# Patient Record
Sex: Male | Born: 1965 | Race: White | Hispanic: Yes | Marital: Married | Smoking: Never smoker
Health system: Southern US, Community
[De-identification: ages and names within clinical notes are randomized; demographics above are authoritative.]

## PROBLEM LIST (undated history)

## (undated) DIAGNOSIS — S92919A Unspecified fracture of unspecified toe(s), initial encounter for closed fracture: Secondary | ICD-10-CM

## (undated) DIAGNOSIS — S2239XA Fracture of one rib, unspecified side, initial encounter for closed fracture: Secondary | ICD-10-CM

## (undated) DIAGNOSIS — N200 Calculus of kidney: Secondary | ICD-10-CM

## (undated) HISTORY — DX: Fracture of one rib, unspecified side, initial encounter for closed fracture: S22.39XA

## (undated) HISTORY — DX: Calculus of kidney: N20.0

## (undated) HISTORY — DX: Unspecified fracture of unspecified toe(s), initial encounter for closed fracture: S92.919A

---

## 2010-06-19 DIAGNOSIS — N200 Calculus of kidney: Secondary | ICD-10-CM

## 2010-06-19 HISTORY — DX: Calculus of kidney: N20.0

## 2011-05-24 ENCOUNTER — Emergency Department (HOSPITAL_COMMUNITY): Payer: Self-pay

## 2011-05-24 ENCOUNTER — Emergency Department (HOSPITAL_COMMUNITY)
Admission: EM | Admit: 2011-05-24 | Discharge: 2011-05-24 | Disposition: A | Discharge status: Home / Self Care | Payer: Self-pay | Attending: Emergency Medicine | Admitting: Emergency Medicine

## 2011-05-24 ENCOUNTER — Encounter: Payer: Self-pay | Admitting: Emergency Medicine

## 2011-05-24 DIAGNOSIS — R112 Nausea with vomiting, unspecified: Secondary | ICD-10-CM | POA: Insufficient documentation

## 2011-05-24 DIAGNOSIS — N201 Calculus of ureter: Secondary | ICD-10-CM | POA: Insufficient documentation

## 2011-05-24 DIAGNOSIS — N200 Calculus of kidney: Secondary | ICD-10-CM

## 2011-05-24 DIAGNOSIS — R109 Unspecified abdominal pain: Secondary | ICD-10-CM | POA: Insufficient documentation

## 2011-05-24 LAB — URINE MICROSCOPIC-ADD ON

## 2011-05-24 LAB — POCT I-STAT, CHEM 8
Creatinine, Ser: 1 mg/dL (ref 0.50–1.35)
Glucose, Bld: 139 mg/dL — ABNORMAL HIGH (ref 70–99)
HCT: 47 % (ref 39.0–52.0)
Hemoglobin: 16 g/dL (ref 13.0–17.0)
Potassium: 4.2 mEq/L (ref 3.5–5.1)
Sodium: 143 mEq/L (ref 135–145)
TCO2: 25 mmol/L (ref 0–100)

## 2011-05-24 LAB — URINALYSIS, ROUTINE W REFLEX MICROSCOPIC
Bilirubin Urine: NEGATIVE
Specific Gravity, Urine: 1.028 (ref 1.005–1.030)
pH: 8 (ref 5.0–8.0)

## 2011-05-24 MED ORDER — HYDROCODONE-ACETAMINOPHEN 5-325 MG PO TABS: 2.0000 | ORAL_TABLET | ORAL | Status: AC | PRN

## 2011-05-24 MED ORDER — TAMSULOSIN HCL 0.4 MG PO CAPS: 0.4000 mg | ORAL_CAPSULE | Freq: Every day | ORAL | Status: DC

## 2011-05-24 NOTE — ED Notes (Signed)
Patient transported to CT 

## 2011-05-24 NOTE — ED Notes (Signed)
R sided abd pain that radiates to R flank since 1 pm with nausea and vomiting.  Decreased urination and pain with urination.

## 2011-05-24 NOTE — ED Provider Notes (Signed)
History     CSN: [id number] Arrival date & time: 05/24/2011  5:27 PM   First MD Initiated Contact with Patient 05/24/11 2104      Chief Complaint  Patient presents with  . Abdominal Pain     HPI  History provided by the patient. Patient presents with complaints of right flank and lower abdominal pain that began acutely around 1 PM today. Patient states pain was very extreme and was persistent for several hours. Pain felt like a sharp ache and cramp. Patient had some associated nausea and vomiting. He denies similar symptoms previously. Patient states at that time pain seemed to be worse with some movements and positions. Patient denies fever, chills, hematuria, urinary frequency, dysuria, diarrhea or constipation. Patient has no other significant past medical history.   History reviewed. No pertinent past medical history.  History reviewed. No pertinent past surgical history.  History reviewed. No pertinent family history.  History  Substance Use Topics  . Smoking status: Never Smoker   . Smokeless tobacco: Not on file  . Alcohol Use: No      Review of Systems  Constitutional: Negative for fever and chills.  Respiratory: Negative for cough and shortness of breath.   Cardiovascular: Negative for chest pain.  Gastrointestinal: Positive for nausea and vomiting. Negative for diarrhea and constipation.  Genitourinary: Negative for dysuria, frequency and hematuria.  All other systems reviewed and are negative.    Allergies  Review of patient's allergies indicates no known allergies.  Home Medications  No current outpatient prescriptions on file.  BP 121/76  Pulse 65  Temp(Src) 97.6 F (36.4 C) (Oral)  Resp 22  SpO2 96%  Physical Exam  Nursing note and vitals reviewed. Constitutional: He is oriented to person, place, and time. He appears well-developed and well-nourished. No distress.  HENT:  Head: Normocephalic.  Neck: Normal range of motion.  Cardiovascular:  Normal rate, regular rhythm and normal heart sounds.   Pulmonary/Chest: Effort normal and breath sounds normal. He has no wheezes. He has no rales.  Abdominal: Soft. There is no tenderness. There is no rigidity, no rebound, no guarding, no CVA tenderness, no tenderness at McBurney's point and negative Murphy's sign.  Neurological: He is alert and oriented to person, place, and time.  Skin: Skin is warm.  Psychiatric: His behavior is normal.    ED Course  Procedures (including critical care time)  Labs Reviewed  URINALYSIS, ROUTINE W REFLEX MICROSCOPIC - Abnormal; Notable for the following:    Hgb urine dipstick MODERATE (*)    Protein, ur 30 (*)    All other components within normal limits  URINE MICROSCOPIC-ADD ON   Results for orders placed during the hospital encounter of 05/24/11  URINALYSIS, ROUTINE W REFLEX MICROSCOPIC      Component Value Range   Color, Urine YELLOW  YELLOW    APPearance CLEAR  CLEAR    Specific Gravity, Urine 1.028  1.005 - 1.030    pH 8.0  5.0 - 8.0    Glucose, UA NEGATIVE  NEGATIVE (mg/dL)   Hgb urine dipstick MODERATE (*) NEGATIVE    Bilirubin Urine NEGATIVE  NEGATIVE    Ketones, ur NEGATIVE  NEGATIVE (mg/dL)   Protein, ur 30 (*) NEGATIVE (mg/dL)   Urobilinogen, UA 1.0  0.0 - 1.0 (mg/dL)   Nitrite NEGATIVE  NEGATIVE    Leukocytes, UA NEGATIVE  NEGATIVE   URINE MICROSCOPIC-ADD ON      Component Value Range   Squamous Epithelial / LPF RARE  RARE    WBC, UA 0-2  <3 (WBC/hpf)   RBC / HPF 21-50  <3 (RBC/hpf)   Urine-Other MUCOUS PRESENT    POCT I-STAT, CHEM 8      Component Value Range   Sodium 143  135 - 145 (mEq/L)   Potassium 4.2  3.5 - 5.1 (mEq/L)   Chloride 107  96 - 112 (mEq/L)   BUN 18  6 - 23 (mg/dL)   Creatinine, Ser 9.56  0.50 - 1.35 (mg/dL)   Glucose, Bld 213 (*) 70 - 99 (mg/dL)   Calcium, Ion 0.86  1.12 - 1.32 (mmol/L)   TCO2 25  0 - 100 (mmol/L)   Hemoglobin 16.0  13.0 - 17.0 (g/dL)   HCT 57.8  46.9 - 62.9 (%)     Ct Abdomen  Pelvis Wo Contrast  05/24/2011  *RADIOLOGY REPORT*  Clinical Data: Right flank pain.  Question ureteral calculus.  CT ABDOMEN AND PELVIS WITHOUT CONTRAST  Technique:  Multidetector CT imaging of the abdomen and pelvis was performed following the standard protocol without intravenous contrast.  Comparison: None.  Findings: There are small calcified granulomas at the right lung base.  There is an old rib fracture posteriorly on the right.  The right kidney demonstrates mild hydronephrosis and perinephric soft tissue stranding.  The right ureter is dilated to the ureteral vesicle junction where there is a 2 mm obstructing calculus on image 76.  This is not clearly seen on the scout image.  No renal calculi are demonstrated.  The left kidney appears normal.  There is mild hepatic steatosis.  The spleen, gallbladder, pancreas and adrenal glands appear normal.  The appendix appears normal. There is degenerative disc disease in the lower lumbar spine, most advanced at L5-S1.  Small central prostatic calcifications are noted.  IMPRESSION:  1.  Obstructing 2 mm calculus of the right ureteral vesicle junction. 2.  No renal calculi. 3.  Calcified granulomas at the right lung base. 4.  Hepatic steatosis. 5.  Lower lumbar spondylosis.  Original Report Authenticated By: Gerrianne Scale, M.D.     1. Kidney stone       MDM  9:00 PM patient seen and evaluated. Patient no acute distress and denies pain at this time.        Angus Seller, PA 05/25/11 [id number]

## 2011-05-25 NOTE — ED Provider Notes (Signed)
Medical screening examination/treatment/procedure(s) were performed by non-physician practitioner and as supervising physician I was immediately available for consultation/collaboration.   Joya Gaskins, MD 05/25/11 2158

## 2015-07-16 ENCOUNTER — Ambulatory Visit (INDEPENDENT_AMBULATORY_CARE_PROVIDER_SITE_OTHER): Payer: BLUE CROSS/BLUE SHIELD | Admitting: Physician Assistant

## 2015-07-16 VITALS — BP 118/74 | HR 109 | Temp 98.3°F | Resp 18 | Ht 62.5 in | Wt 146.8 lb

## 2015-07-16 DIAGNOSIS — Z13220 Encounter for screening for lipoid disorders: Secondary | ICD-10-CM

## 2015-07-16 DIAGNOSIS — R238 Other skin changes: Secondary | ICD-10-CM

## 2015-07-16 DIAGNOSIS — Z1329 Encounter for screening for other suspected endocrine disorder: Secondary | ICD-10-CM

## 2015-07-16 DIAGNOSIS — Z125 Encounter for screening for malignant neoplasm of prostate: Secondary | ICD-10-CM

## 2015-07-16 DIAGNOSIS — Z1159 Encounter for screening for other viral diseases: Secondary | ICD-10-CM | POA: Diagnosis not present

## 2015-07-16 DIAGNOSIS — Z13228 Encounter for screening for other metabolic disorders: Secondary | ICD-10-CM

## 2015-07-16 DIAGNOSIS — K625 Hemorrhage of anus and rectum: Secondary | ICD-10-CM

## 2015-07-16 DIAGNOSIS — Z114 Encounter for screening for human immunodeficiency virus [HIV]: Secondary | ICD-10-CM

## 2015-07-16 DIAGNOSIS — R233 Spontaneous ecchymoses: Secondary | ICD-10-CM

## 2015-07-16 DIAGNOSIS — Z1211 Encounter for screening for malignant neoplasm of colon: Secondary | ICD-10-CM

## 2015-07-16 LAB — POCT CBC
Granulocyte percent: 50.8 %G (ref 37–80)
HCT, POC: 43.1 % — AB (ref 43.5–53.7)
HEMOGLOBIN: 14.3 g/dL (ref 14.1–18.1)
LYMPH, POC: 1.9 (ref 0.6–3.4)
MCH: 26.2 pg — AB (ref 27–31.2)
MCHC: 33.2 g/dL (ref 31.8–35.4)
MCV: 78.8 fL — AB (ref 80–97)
MID (cbc): 0.5 (ref 0–0.9)
MPV: 6.5 fL (ref 0–99.8)
POC GRANULOCYTE: 2.4 (ref 2–6.9)
POC LYMPH PERCENT: 39.5 %L (ref 10–50)
POC MID %: 9.7 % (ref 0–12)
Platelet Count, POC: 242 10*3/uL (ref 142–424)
RBC: 5.47 M/uL (ref 4.69–6.13)
RDW, POC: 15.7 %
WBC: 4.8 10*3/uL (ref 4.6–10.2)

## 2015-07-16 LAB — COMPREHENSIVE METABOLIC PANEL
ALT: 26 U/L (ref 9–46)
AST: 27 U/L (ref 10–35)
Albumin: 4.2 g/dL (ref 3.6–5.1)
Alkaline Phosphatase: 64 U/L (ref 40–115)
BUN: 12 mg/dL (ref 7–25)
CHLORIDE: 102 mmol/L (ref 98–110)
CO2: 29 mmol/L (ref 20–31)
CREATININE: 0.79 mg/dL (ref 0.70–1.33)
Calcium: 9.2 mg/dL (ref 8.6–10.3)
GLUCOSE: 87 mg/dL (ref 65–99)
POTASSIUM: 4.3 mmol/L (ref 3.5–5.3)
SODIUM: 138 mmol/L (ref 135–146)
TOTAL PROTEIN: 8.1 g/dL (ref 6.1–8.1)
Total Bilirubin: 0.4 mg/dL (ref 0.2–1.2)

## 2015-07-16 LAB — LIPID PANEL
CHOL/HDL RATIO: 2.7 ratio (ref <=5.0)
CHOLESTEROL: 141 mg/dL (ref 125–200)
HDL: 53 mg/dL (ref >=40)
LDL Cholesterol: 77 mg/dL (ref <130)
TRIGLYCERIDES: 53 mg/dL (ref <150)
VLDL: 11 mg/dL (ref <30)

## 2015-07-16 LAB — TSH: TSH: 1.444 u[IU]/mL (ref 0.350–4.500)

## 2015-07-16 MED ORDER — HYDROCORTISONE ACETATE 25 MG RE SUPP: 25.0000 mg | Freq: Two times a day (BID) | RECTAL | Status: DC | PRN

## 2015-07-16 NOTE — Patient Instructions (Signed)
I will contact you with your lab results as soon as they are available.   If you have not heard from me in 2 weeks, please contact me.  The fastest way to get your results is to register for My Chart (see the instructions on the last page of this printout).  The gastroenterology office will call you directly (I asked that they call your wife) to schedule a visit there.

## 2015-07-16 NOTE — Progress Notes (Signed)
Subjective:    Patient ID: Roberto Hunter, male    DOB: 23-Jan-1966, 50 y.o.   MRN: [id number]  Chief Complaint  Patient presents with  . Rectal Bleeding    Noticed today  . Abdominal Pain    HPI Patient presents today for rectal bleeding that began at 10am this morning. Normally has BM 2x/day. Yesterday was unable pass BM. Reports straining today and when BM was passed  it only "dripped out and the water was completely red". Second bowl movement around 11:00am which was solid and red. Associated burning of rectal area after BM, and "uncomfertable feeling" of abdomen. He believes it may be due to a hemorrhoid but presents today to be catious. Denies NSAID use, fever, nausea, vomiting, abdomen distension, black tarry stools, or GU symptoms.    Last seen in office in 2011. Interested in establishing a primary care physician. Wants to get blood work and annual physical exam. Reports easily bruising and back pain. Okay with addressing during another time.   Review of Systems  Constitutional: Negative for fever, chills, diaphoresis, activity change, fatigue and unexpected weight change.  Respiratory: Negative for chest tightness and shortness of breath.   Cardiovascular: Negative for chest pain and palpitations.  Gastrointestinal: Positive for blood in stool, anal bleeding and rectal pain (burning after passing bm). Negative for nausea, vomiting and abdominal distention. Abdominal pain: uncomfterable.  Genitourinary: Negative.   Musculoskeletal: Positive for myalgias and back pain. Negative for neck pain.  Skin: Negative.   Neurological: Negative for numbness.  Hematological: Bruises/bleeds easily.   No Known Allergies  Prior to Admission medications   Not on File   There are no active problems to display for this patient.     Objective:   Physical Exam  Constitutional: He appears well-developed and well-nourished. No distress.  BP 118/74 mmHg  Pulse 109  Temp(Src) 98.3 F (36.8  C) (Oral)  Resp 18  Ht 5' 2.5" (1.588 m)  Wt 146 lb 12.8 oz (66.588 kg)  BMI 26.41 kg/m2  SpO2 99%   HENT:  Head: Normocephalic and atraumatic.  Right Ear: External ear normal.  Left Ear: External ear normal.  Nose: Nose normal.  Mouth/Throat: Oropharynx is clear and moist. No oropharyngeal exudate.  Eyes: Conjunctivae are normal. Pupils are equal, round, and reactive to light. No scleral icterus.  Neck: Trachea normal. Neck supple.  Cardiovascular: Normal rate, regular rhythm, S1 normal, S2 normal and normal heart sounds.  Exam reveals no gallop and no friction rub.   No murmur heard. Pulmonary/Chest: Effort normal and breath sounds normal.  Abdominal: Soft. Normal appearance, normal aorta and bowel sounds are normal. He exhibits no shifting dullness, no distension, no pulsatile liver, no abdominal bruit, no ascites, no pulsatile midline mass and no mass. There is no hepatosplenomegaly. There is no tenderness. There is no rigidity, no rebound, no guarding and no tenderness at McBurney's point.  Musculoskeletal: Normal range of motion.  Lymphadenopathy:       Head (right side): No submental, no submandibular, no tonsillar, no preauricular, no posterior auricular and no occipital adenopathy present.       Head (left side): No submental, no submandibular, no tonsillar, no preauricular, no posterior auricular and no occipital adenopathy present.    He has no cervical adenopathy.  Neurological: He is alert. He has normal strength and normal reflexes. He displays normal reflexes.  Skin: Skin is warm and dry. No rash noted. Nails show no clubbing.     Vertical nail  ridges   Psychiatric: He has a normal mood and affect. His behavior is normal.   Results for orders placed or performed in visit on 07/16/15  POCT CBC  Result Value Ref Range   WBC 4.8 4.6 - 10.2 K/uL   Lymph, poc 1.9 0.6 - 3.4   POC LYMPH PERCENT 39.5 10 - 50 %L   MID (cbc) 0.5 0 - 0.9   POC MID % 9.7 0 - 12 %M   POC  Granulocyte 2.4 2 - 6.9   Granulocyte percent 50.8 37 - 80 %G   RBC 5.47 4.69 - 6.13 M/uL   Hemoglobin 14.3 14.1 - 18.1 g/dL   HCT, POC 47.8 (A) 29.5 - 53.7 %   MCV 78.8 (A) 80 - 97 fL   MCH, POC 26.2 (A) 27 - 31.2 pg   MCHC 33.2 31.8 - 35.4 g/dL   RDW, POC 62.1 %   Platelet Count, POC 242 142 - 424 K/uL   MPV 6.5 0 - 99.8 fL       Assessment & Plan:  1. BRBPR (bright red blood per rectum) - hydrocortisone (ANUSOL-HC) 25 MG suppository; Place 1 suppository (25 mg total) rectally 2 (two) times daily as needed for hemorrhoids or itching.  Dispense: 12 suppository; Refill: 0  2. Easy bruising - POCT CBC  3. Screening for colon cancer - Ambulatory referral to Gastroenterology  4. Screening for prostate cancer - PSA  5. Screening for thyroid disorder - TSH  6. Screening for metabolic disorder - Comprehensive metabolic panel  7. Screening for hyperlipidemia - Lipid panel  8. Screening for HIV (human immunodeficiency virus) - HIV antibody  9. Need for hepatitis C screening test - Hepatitis C antibody  Return for Wellness Visit, at your convenience.Marland Kitchen

## 2015-07-16 NOTE — Progress Notes (Signed)
Subjective:   Patient ID: Roberto Hunter, male     DOB: 11-29-1965, 50 y.o.    MRN: [id number]  PCP: No primary care provider on file.  Chief Complaint  Patient presents with  . Rectal Bleeding    Noticed today  . Abdominal Pain    HPI  Presents for evaluation of rectal bleeding that began at 10am this morning. HIs wife is present and helps to translate.  Normally has BM 2x/day. Yesterday was unable pass BM. Reports straining today and when BM was passed it only "dripped out and the water was completely red". Second bowl movement around 11:00am which was solid and red. Associated burning of rectal area after BM, and "uncomfertable feeling" of abdomen. He believes it may be due to a hemorrhoid but presents today to be catious.  Denies NSAID use, fever, nausea, vomiting, abdomen distension, black tarry stools, or GU symptoms.   Last seen in office in 2011. Interested in establishing a primary care physician. Wants to get blood work and annual physical exam. Reports easily bruising and back pain. Okay with addressing during another time.    Prior to Admission medications   Not on File     No Known Allergies   There are no active problems to display for this patient.    History reviewed. No pertinent family history.   Social History   Social History  . Marital Status: Single    Spouse Name: N/A  . Number of Children: 1  . Years of Education: N/A   Occupational History  . Auto [employer]    Social History Main Topics  . Smoking status: Never Smoker   . Smokeless tobacco: Not on file  . Alcohol Use: No  . Drug Use: No  . Sexual Activity:    Partners: Female    Copy: None   Other Topics Concern  . Not on file   Social History Narrative   Patient from [place] in [place]. Wife from [place]         Review of Systems Constitutional: Negative for fever, chills, diaphoresis, activity change, fatigue and unexpected weight  change.  Respiratory: Negative for chest tightness and shortness of breath.  Cardiovascular: Negative for chest pain and palpitations.  Gastrointestinal: Positive for blood in stool, anal bleeding and rectal pain (burning after passing bm). Negative for nausea, vomiting and abdominal distention. Abdominal pain: uncomfterable.  Genitourinary: Negative.  Musculoskeletal: Positive for myalgias and back pain. Negative for neck pain.  Skin: Negative.  Neurological: Negative for numbness.  Hematological: Bruises/bleeds easily.       Objective:  Physical Exam  Constitutional: He is oriented to person, place, and time. Vital signs are normal. He appears well-developed and well-nourished. He is active and cooperative. No distress.  BP 118/74 mmHg  Pulse 109  Temp(Src) 98.3 F (36.8 C) (Oral)  Resp 18  Ht 5' 2.5" (1.588 m)  Wt 146 lb 12.8 oz (66.588 kg)  BMI 26.41 kg/m2  SpO2 99%  HENT:  Head: Normocephalic and atraumatic.  Right Ear: Hearing normal.  Left Ear: Hearing normal.  Eyes: Conjunctivae are normal. No scleral icterus.  Neck: Normal range of motion. Neck supple. No thyromegaly present.  Cardiovascular: Normal rate, regular rhythm and normal heart sounds.   Pulses:      Radial pulses are 2+ on the right side, and 2+ on the left side.  Pulmonary/Chest: Effort normal and breath sounds normal.  Abdominal: Soft. Bowel sounds are normal. He  exhibits no distension and no mass. There is no tenderness. There is no rebound and no guarding.  Genitourinary: Rectal exam shows no external hemorrhoid, no internal hemorrhoid, no fissure, no mass, no tenderness and anal tone normal. Guaiac negative stool. Prostate is not enlarged and not tender.  Lymphadenopathy:       Head (right side): No tonsillar, no preauricular, no posterior auricular and no occipital adenopathy present.       Head (left side): No tonsillar, no preauricular, no posterior auricular and no occipital adenopathy present.     He has no cervical adenopathy.       Right: No supraclavicular adenopathy present.       Left: No supraclavicular adenopathy present.  Neurological: He is alert and oriented to person, place, and time. No sensory deficit.  Skin: Skin is warm, dry and intact. No rash noted. No cyanosis or erythema. Nails show no clubbing.  Psychiatric: He has a normal mood and affect. His speech is normal and behavior is normal.    Results for orders placed or performed in visit on 07/16/15  POCT CBC  Result Value Ref Range   WBC 4.8 4.6 - 10.2 K/uL   Lymph, poc 1.9 0.6 - 3.4   POC LYMPH PERCENT 39.5 10 - 50 %L   MID (cbc) 0.5 0 - 0.9   POC MID % 9.7 0 - 12 %M   POC Granulocyte 2.4 2 - 6.9   Granulocyte percent 50.8 37 - 80 %G   RBC 5.47 4.69 - 6.13 M/uL   Hemoglobin 14.3 14.1 - 18.1 g/dL   HCT, POC 04.5 (A) 40.9 - 53.7 %   MCV 78.8 (A) 80 - 97 fL   MCH, POC 26.2 (A) 27 - 31.2 pg   MCHC 33.2 31.8 - 35.4 g/dL   RDW, POC 81.1 %   Platelet Count, POC 242 142 - 424 K/uL   MPV 6.5 0 - 99.8 fL            Assessment & Plan:  1. BRBPR (bright red blood per rectum) While exam did not reveal a hemorrhoid, his history strongly suggests that. - hydrocortisone (ANUSOL-HC) 25 MG suppository; Place 1 suppository (25 mg total) rectally 2 (two) times daily as needed for hemorrhoids or itching.  Dispense: 12 suppository; Refill: 0  2. Easy bruising Normal CBC today. Reassured that the bruising is most likely due to the work he does, spending lots of time kneeling. - POCT CBC  3. Screening for colon cancer - Ambulatory referral to Gastroenterology  4. Screening for prostate cancer - PSA  5. Screening for thyroid disorder - TSH  6. Screening for metabolic disorder - Comprehensive metabolic panel  7. Screening for hyperlipidemia - Lipid panel  8. Screening for HIV (human immunodeficiency virus) - HIV antibody  9. Need for hepatitis C screening test - Hepatitis C antibody   Fernande Bras, PA-C Physician Assistant-Certified [facility]

## 2015-07-17 LAB — HIV ANTIBODY (ROUTINE TESTING W REFLEX): HIV 1&2 Ab, 4th Generation: NONREACTIVE

## 2015-07-17 LAB — PSA: PSA: 0.4 ng/mL (ref <=4.00)

## 2015-07-17 LAB — HEPATITIS C ANTIBODY: HCV AB: NEGATIVE

## 2015-07-20 ENCOUNTER — Encounter: Payer: Self-pay | Admitting: Internal Medicine

## 2015-07-20 ENCOUNTER — Encounter: Payer: Self-pay | Admitting: Physician Assistant

## 2015-08-02 ENCOUNTER — Ambulatory Visit (AMBULATORY_SURGERY_CENTER): Payer: Self-pay

## 2015-08-02 VITALS — Ht 64.0 in | Wt 151.0 lb

## 2015-08-02 DIAGNOSIS — Z1211 Encounter for screening for malignant neoplasm of colon: Secondary | ICD-10-CM

## 2015-08-02 MED ORDER — NA SULFATE-K SULFATE-MG SULF 17.5-3.13-1.6 GM/177ML PO SOLN: 1.0000 | Freq: Once | ORAL | Status: DC

## 2015-08-02 NOTE — Progress Notes (Signed)
No egg or soy allergies Not on home 02 No previous anesthesia complications No diet or weight loss meds 

## 2015-08-03 ENCOUNTER — Encounter: Payer: Self-pay | Admitting: *Deleted

## 2015-08-06 ENCOUNTER — Telehealth: Payer: Self-pay | Admitting: Internal Medicine

## 2015-08-09 NOTE — Telephone Encounter (Signed)
Left message that I would leave a free prep up front to be picked up and to call me if he had questions about it

## 2015-08-11 ENCOUNTER — Ambulatory Visit (AMBULATORY_SURGERY_CENTER): Payer: BLUE CROSS/BLUE SHIELD | Admitting: Internal Medicine

## 2015-08-11 ENCOUNTER — Encounter: Payer: Self-pay | Admitting: Internal Medicine

## 2015-08-11 VITALS — BP 102/72 | HR 53 | Temp 98.2°F | Resp 13 | Ht 64.0 in | Wt 151.0 lb

## 2015-08-11 DIAGNOSIS — Z1211 Encounter for screening for malignant neoplasm of colon: Secondary | ICD-10-CM

## 2015-08-11 MED ORDER — SODIUM CHLORIDE 0.9 % IV SOLN
500.0000 mL | INTRAVENOUS | Status: DC
Start: 2015-08-11 — End: 2015-08-11

## 2015-08-11 NOTE — Progress Notes (Signed)
To PACU-pt awake and alert.  Report to RN 

## 2015-08-11 NOTE — Patient Instructions (Signed)
YOU HAD AN ENDOSCOPIC PROCEDURE TODAY AT THE [facility]:   Refer to the procedure report that was given to you for any specific questions about what was found during the examination.  If the procedure report does not answer your questions, please call your gastroenterologist to clarify.  If you requested that your care partner not be given the details of your procedure findings, then the procedure report has been included in a sealed envelope for you to review at your convenience later.  YOU SHOULD EXPECT: Some feelings of bloating in the abdomen. Passage of more gas than usual.  Walking can help get rid of the air that was put into your GI tract during the procedure and reduce the bloating. If you had a lower endoscopy (such as a colonoscopy or flexible sigmoidoscopy) you may notice spotting of blood in your stool or on the toilet paper. If you underwent a bowel prep for your procedure, you may not have a normal bowel movement for a few days.  Please Note:  You might notice some irritation and congestion in your nose or some drainage.  This is from the oxygen used during your procedure.  There is no need for concern and it should clear up in a day or so.  SYMPTOMS TO REPORT IMMEDIATELY:   Following lower endoscopy (colonoscopy or flexible sigmoidoscopy):  Excessive amounts of blood in the stool  Significant tenderness or worsening of abdominal pains  Swelling of the abdomen that is new, acute  Fever of 100F or higher   For urgent or emergent issues, a gastroenterologist can be reached at any hour by calling [phone].   DIET: Your first meal following the procedure should be a small meal and then it is ok to progress to your normal diet. Heavy or fried foods are harder to digest and may make you feel nauseous or bloated.  Likewise, meals heavy in dairy and vegetables can increase bloating.  Drink plenty of fluids but you should avoid alcoholic beverages for 24  hours.  ACTIVITY:  You should plan to take it easy for the rest of today and you should NOT DRIVE or use heavy machinery until tomorrow (because of the sedation medicines used during the test).    FOLLOW UP: Our staff will call the number listed on your records the next business day following your procedure to check on you and address any questions or concerns that you may have regarding the information given to you following your procedure. If we do not reach you, we will leave a message.  However, if you are feeling well and you are not experiencing any problems, there is no need to return our call.  We will assume that you have returned to your regular daily activities without incident.  If any biopsies were taken you will be contacted by phone or by letter within the next 1-3 weeks.  Please call us at [phone] if you have not heard about the biopsies in 3 weeks.    SIGNATURES/CONFIDENTIALITY: You and/or your care partner have signed paperwork which will be entered into your electronic medical record.  These signatures attest to the fact that that the information above on your After Visit Summary has been reviewed and is understood.  Full responsibility of the confidentiality of this discharge information lies with you and/or your care-partner.   Resume medications. 

## 2015-08-11 NOTE — Op Note (Signed)
[facility] [address] [place], [address]   COLONOSCOPY PROCEDURE REPORT  PATIENT: Roberto Hunter, Roberto Hunter  MR#: [id number] BIRTHDATE: 1966/03/11 , 50  yrs. old GENDER: male ENDOSCOPIST: Roxy Cedar, MD REFERRED HQ:[facility] [place], [place] PROCEDURE DATE:  08/11/2015 PROCEDURE:   Colonoscopy, screening First Screening Colonoscopy - Avg.  risk and is 50 yrs.  old or older Yes.  Prior Negative Screening - Now for repeat screening. N/A  History of Adenoma - Now for follow-up colonoscopy & has been > or = to 3 yrs.  N/A  Polyps removed today? No Recommend repeat exam, <10 yrs? No ASA CLASS:   Class I INDICATIONS:Screening for colonic neoplasia and Colorectal Neoplasm Risk Assessment for this procedure is average risk. MEDICATIONS: Monitored anesthesia care and Propofol 400 mg IV  DESCRIPTION OF PROCEDURE:   After the risks benefits and alternatives of the procedure were thoroughly explained, informed consent was obtained.  The digital rectal exam revealed no abnormalities of the rectum.   The [id number]  endoscope was introduced through the anus and advanced to the cecum, which was identified by both the appendix and ileocecal valve. No adverse events experienced.   The quality of the prep was excellent. (Suprep was used)  The instrument was then slowly withdrawn as the colon was fully examined. Estimated blood loss is zero unless otherwise noted in this procedure report.    COLON FINDINGS: A normal appearing cecum, ileocecal valve, and appendiceal orifice were identified.  The ascending, transverse, descending, sigmoid colon, and rectum appeared unremarkable. Retroflexed views revealed internal hemorrhoids. The time to cecum = 1.8 Withdrawal time = 10.6   The scope was withdrawn and the procedure completed. COMPLICATIONS: There were no immediate complications.  ENDOSCOPIC IMPRESSION: Normal colonoscopy  RECOMMENDATIONS: Continue current colorectal  screening recommendations for "routine risk" patients with a repeat colonoscopy in 10 years.  eSigned:  Roxy Cedar, MD 08/11/2015 9:18 AM   cc: The Patient and Theora Gianotti, [place]

## 2015-08-12 ENCOUNTER — Telehealth: Payer: Self-pay | Admitting: *Deleted

## 2015-08-12 NOTE — Telephone Encounter (Signed)
  Follow up Call-  Call back number 08/11/2015  Post procedure Call Back phone  # [phone]  Permission to leave phone message Yes     Patient questions:  Do you have a fever, pain , or abdominal swelling? No. Pain Score  0 *  Have you tolerated food without any problems? Yes.    Have you been able to return to your normal activities? Yes.    Do you have any questions about your discharge instructions: Diet   No. Medications  No. Follow up visit  No.  Do you have questions or concerns about your Care? No.  Actions: * If pain score is 4 or above: No action needed, pain <4. Spoke with pts wife who says pt did not have any problems after procedure and has gone to work

## 2017-09-01 ENCOUNTER — Emergency Department (HOSPITAL_COMMUNITY): Payer: Commercial Managed Care - PPO

## 2017-09-01 ENCOUNTER — Inpatient Hospital Stay (HOSPITAL_COMMUNITY): Payer: Commercial Managed Care - PPO

## 2017-09-01 ENCOUNTER — Encounter (HOSPITAL_COMMUNITY): Payer: Self-pay

## 2017-09-01 ENCOUNTER — Other Ambulatory Visit: Payer: Self-pay

## 2017-09-01 ENCOUNTER — Inpatient Hospital Stay (HOSPITAL_COMMUNITY)
Admission: EM | Admit: 2017-09-01 | Discharge: 2017-09-03 | DRG: 964 | Disposition: A | Discharge status: Home / Self Care | Payer: Commercial Managed Care - PPO | Attending: General Surgery | Admitting: General Surgery

## 2017-09-01 DIAGNOSIS — S066X1A Traumatic subarachnoid hemorrhage with loss of consciousness of 30 minutes or less, initial encounter: Secondary | ICD-10-CM | POA: Diagnosis not present

## 2017-09-01 DIAGNOSIS — S301XXA Contusion of abdominal wall, initial encounter: Secondary | ICD-10-CM | POA: Diagnosis present

## 2017-09-01 DIAGNOSIS — R402362 Coma scale, best motor response, obeys commands, at arrival to emergency department: Secondary | ICD-10-CM | POA: Diagnosis present

## 2017-09-01 DIAGNOSIS — S32059A Unspecified fracture of fifth lumbar vertebra, initial encounter for closed fracture: Secondary | ICD-10-CM | POA: Diagnosis present

## 2017-09-01 DIAGNOSIS — S270XXA Traumatic pneumothorax, initial encounter: Secondary | ICD-10-CM | POA: Diagnosis present

## 2017-09-01 DIAGNOSIS — S22089A Unspecified fracture of T11-T12 vertebra, initial encounter for closed fracture: Secondary | ICD-10-CM | POA: Diagnosis present

## 2017-09-01 DIAGNOSIS — R402252 Coma scale, best verbal response, oriented, at arrival to emergency department: Secondary | ICD-10-CM | POA: Diagnosis present

## 2017-09-01 DIAGNOSIS — T07XXXA Unspecified multiple injuries, initial encounter: Secondary | ICD-10-CM | POA: Diagnosis not present

## 2017-09-01 DIAGNOSIS — S2243XA Multiple fractures of ribs, bilateral, initial encounter for closed fracture: Secondary | ICD-10-CM | POA: Diagnosis present

## 2017-09-01 DIAGNOSIS — W132XXA Fall from, out of or through roof, initial encounter: Secondary | ICD-10-CM | POA: Diagnosis present

## 2017-09-01 DIAGNOSIS — R402142 Coma scale, eyes open, spontaneous, at arrival to emergency department: Secondary | ICD-10-CM | POA: Diagnosis present

## 2017-09-01 DIAGNOSIS — Y92008 Other place in unspecified non-institutional (private) residence as the place of occurrence of the external cause: Secondary | ICD-10-CM

## 2017-09-01 DIAGNOSIS — W19XXXA Unspecified fall, initial encounter: Secondary | ICD-10-CM | POA: Diagnosis present

## 2017-09-01 DIAGNOSIS — J939 Pneumothorax, unspecified: Secondary | ICD-10-CM

## 2017-09-01 LAB — BASIC METABOLIC PANEL
ANION GAP: 11 (ref 5–15)
BUN: 14 mg/dL (ref 6–20)
CHLORIDE: 106 mmol/L (ref 101–111)
CO2: 22 mmol/L (ref 22–32)
CREATININE: 1.06 mg/dL (ref 0.61–1.24)
Calcium: 9.3 mg/dL (ref 8.9–10.3)
GFR calc Af Amer: >60 mL/min (ref >60)
GFR calc non Af Amer: >60 mL/min (ref >60)
Glucose, Bld: 151 mg/dL — ABNORMAL HIGH (ref 65–99)
POTASSIUM: 4 mmol/L (ref 3.5–5.1)
Sodium: 139 mmol/L (ref 135–145)

## 2017-09-01 LAB — URINALYSIS, ROUTINE W REFLEX MICROSCOPIC
Bilirubin Urine: NEGATIVE
Glucose, UA: NEGATIVE mg/dL
Ketones, ur: NEGATIVE mg/dL
Leukocytes, UA: NEGATIVE
Nitrite: NEGATIVE
PH: 5 (ref 5.0–8.0)
Protein, ur: 100 mg/dL — AB

## 2017-09-01 LAB — CBC WITH DIFFERENTIAL/PLATELET
Basophils Absolute: 0 10*3/uL (ref 0.0–0.1)
Basophils Relative: 0 %
Eosinophils Absolute: 0 10*3/uL (ref 0.0–0.7)
Eosinophils Relative: 0 %
HEMATOCRIT: 43.6 % (ref 39.0–52.0)
HEMOGLOBIN: 13.7 g/dL (ref 13.0–17.0)
LYMPHS ABS: 1.5 10*3/uL (ref 0.7–4.0)
Lymphocytes Relative: 8 %
MCH: 25.6 pg — ABNORMAL LOW (ref 26.0–34.0)
MCHC: 31.4 g/dL (ref 30.0–36.0)
MCV: 81.5 fL (ref 78.0–100.0)
MONOS PCT: 6 %
Monocytes Absolute: 1.2 10*3/uL (ref 0.1–1.0)
NEUTROS ABS: 16.3 10*3/uL (ref 1.7–7.7)
NEUTROS PCT: 86 %
Platelets: 241 10*3/uL (ref 150–400)
RBC: 5.35 MIL/uL (ref 4.22–5.81)
RDW: 15.4 % (ref 11.5–15.5)
WBC: 19 10*3/uL — AB (ref 4.0–10.5)

## 2017-09-01 MED ORDER — ONDANSETRON HCL 4 MG/2ML IJ SOLN: 4.0000 mg | Freq: Four times a day (QID) | INTRAMUSCULAR | Status: DC | PRN

## 2017-09-01 MED ORDER — IOPAMIDOL (ISOVUE-370) INJECTION 76%
INTRAVENOUS | Status: AC
  Administered 2017-09-01: 75 mL
  Filled 2017-09-01: qty 100

## 2017-09-01 MED ORDER — ACETAMINOPHEN 325 MG PO TABS
650.0000 mg | ORAL_TABLET | Freq: Four times a day (QID) | ORAL | Status: DC
  Administered 2017-09-01 – 2017-09-03 (×4): 650 mg via ORAL
  Filled 2017-09-01 (×5): qty 2

## 2017-09-01 MED ORDER — HYDROMORPHONE HCL 1 MG/ML IJ SOLN: 0.5000 mg | INTRAMUSCULAR | Status: DC | PRN

## 2017-09-01 MED ORDER — ONDANSETRON 4 MG PO TBDP: 4.0000 mg | ORAL_TABLET | Freq: Four times a day (QID) | ORAL | Status: DC | PRN

## 2017-09-01 MED ORDER — ONDANSETRON HCL 4 MG/2ML IJ SOLN
4.0000 mg | Freq: Once | INTRAMUSCULAR | Status: AC
  Administered 2017-09-01: 4 mg via INTRAVENOUS
  Filled 2017-09-01: qty 2

## 2017-09-01 MED ORDER — OXYCODONE HCL 5 MG PO TABS
5.0000 mg | ORAL_TABLET | Freq: Four times a day (QID) | ORAL | Status: DC | PRN
  Administered 2017-09-02 – 2017-09-03 (×4): 5 mg via ORAL
  Filled 2017-09-01 (×5): qty 1

## 2017-09-01 MED ORDER — IOPAMIDOL (ISOVUE-300) INJECTION 61%
INTRAVENOUS | Status: AC
  Administered 2017-09-01: 100 mL
  Filled 2017-09-01: qty 100

## 2017-09-01 MED ORDER — FENTANYL CITRATE (PF) 100 MCG/2ML IJ SOLN
50.0000 ug | INTRAMUSCULAR | Status: DC | PRN
Start: 2017-09-01 — End: 2017-09-01
  Administered 2017-09-01 (×3): 50 ug via INTRAVENOUS
  Filled 2017-09-01 (×3): qty 2

## 2017-09-01 MED ORDER — LACTATED RINGERS IV SOLN
INTRAVENOUS | Status: DC
  Administered 2017-09-01 – 2017-09-02 (×3): via INTRAVENOUS

## 2017-09-01 MED ORDER — ONDANSETRON 4 MG PO TBDP: 4.0000 mg | ORAL_TABLET | Freq: Once | ORAL | Status: DC | PRN

## 2017-09-01 NOTE — ED Provider Notes (Signed)
[facility]   CSN: [id number] Arrival date & time: 09/01/17  1054     History   Chief Complaint Chief Complaint  Patient presents with  . Fall    HPI Roberto Hunter is a 52 y.o. male.  Chief complaint is fell from roof  HPI 52 year old male.  Was up on his wrist.  Larey SeatFell.  Landed left posterior oblique.  Complains of pain down his left back left chest.  Mild headache.  No neck pain.  Able to move himself a short distance.  Friends drove him here.  Past Medical History:  Diagnosis Date  . Fracture, rib   . Fracture, toe   . Nephrolithiasis 2012    There are no active problems to display for this patient.   History reviewed. No pertinent surgical history.     Home Medications    Prior to Admission medications   Medication Sig Start Date End Date Taking? Authorizing Provider  acetaminophen (TYLENOL) 325 MG tablet Take 650 mg by mouth every 6 (six) hours as needed for mild pain.   Yes [provider]  Multiple Vitamin (MULTIVITAMIN) tablet Take 1 tablet by mouth daily.   Yes [provider]  hydrocortisone (ANUSOL-HC) 25 MG suppository Place 1 suppository (25 mg total) rectally 2 (two) times daily as needed for hemorrhoids or itching. Patient not taking: Reported on 08/02/2015 07/16/15   Porfirio OarJeffery, Chelle, PA-C    Family History Family History  Problem Relation Age of Onset  . Colon cancer Neg Hx     Social History Social History   Tobacco Use  . Smoking status: Never Smoker  . Smokeless tobacco: Never Used  Substance Use Topics  . Alcohol use: No    Alcohol/week: 0.0 oz  . Drug use: No     Allergies   Patient has no known allergies.   Review of Systems Review of Systems  Constitutional: Negative for appetite change, chills, diaphoresis, fatigue and fever.  HENT: Negative for mouth sores, sore throat and trouble swallowing.   Eyes: Negative for visual disturbance.  Respiratory:  Negative for cough, chest tightness, shortness of breath and wheezing.   Cardiovascular: Negative for chest pain.  Gastrointestinal: Negative for abdominal distention, abdominal pain, diarrhea, nausea and vomiting.  Endocrine: Negative for polydipsia, polyphagia and polyuria.  Genitourinary: Negative for dysuria, frequency and hematuria.  Musculoskeletal: Negative for gait problem.       Chest pain back pain.  Skin: Negative for color change, pallor and rash.  Neurological: Negative for dizziness, syncope, light-headedness and headaches.  Hematological: Does not bruise/bleed easily.  Psychiatric/Behavioral: Negative for behavioral problems and confusion.     Physical Exam Updated Vital Signs BP 119/73   Pulse 92   Temp 97.7 F (36.5 C)   Resp 19   Wt 68.5 kg (151 lb)   SpO2 98%   BMI 25.92 kg/m   Physical Exam  Constitutional: He is oriented to person, place, and time. No distress.  52 year old male.  He is sitting in a wheelchair at the bedside.  Is wearing a cervical collar.  With myself, and attack were able to put her arms under his shoulders lift him set him under the bed in a semirecumbent position in the cervical collar.  He tolerated this well.  HENT:  Head: Normocephalic.  No blood over TMs, mastoids, or from ears nose or mouth.  No midline neck pain.  Eyes: Conjunctivae are normal. Pupils are equal, round, and reactive to  light. No scleral icterus.  Neck: Normal range of motion. Neck supple. No thyromegaly present.  Cardiovascular: Normal rate and regular rhythm. Exam reveals no gallop and no friction rub.  No murmur heard. Pulmonary/Chest: Effort normal and breath sounds normal. No respiratory distress. He has no wheezes. He has no rales.  Diffusely tender to the posterior chest left greater than right.  Symmetric breath sounds without crepitus, or subcu air.  Abdominal: Soft. Bowel sounds are normal. He exhibits no distension. There is no tenderness. There is no  rebound.  Soft benign abdomen.  Stable pelvis  Musculoskeletal: Normal range of motion.  Neurological: He is alert and oriented to person, place, and time.  Moving all 4 extremities.  Normal sensation to the 4 extremities.  Normal reflexes.  Skin: Skin is warm and dry. No rash noted.  Psychiatric: He has a normal mood and affect. His behavior is normal.     ED Treatments / Results  Labs (all labs ordered are listed, but only abnormal results are displayed) Labs Reviewed  CBC WITH DIFFERENTIAL/PLATELET - Abnormal; Notable for the following components:      Result Value   WBC 19.0 (*)    MCH 25.6 (*)    All other components within normal limits  BASIC METABOLIC PANEL - Abnormal; Notable for the following components:   Glucose, Bld 151 (*)    All other components within normal limits  URINALYSIS, ROUTINE W REFLEX MICROSCOPIC    EKG  EKG Interpretation None       Radiology Dg Chest 1 View  Result Date: 09/01/2017 CLINICAL DATA:  52 year old who fell approximately 10-12 feet off of his roof earlier today. Left-sided chest pain. Initial encounter. EXAM: CHEST  1 VIEW COMPARISON:  None. FINDINGS: Cardiac silhouette upper normal in size for AP technique. Thoracic aorta mildly tortuous. Hilar and mediastinal contours otherwise unremarkable. Minimal linear atelectasis or scar at the left lung base. Lungs otherwise clear. Pulmonary vascularity normal. No visible pleural effusions. No pneumothorax. Old healed left rib fractures.  No acute fractures are visible. IMPRESSION: Minimal linear atelectasis or scar at the left lung base. No acute cardiopulmonary disease otherwise. Electronically Signed   By: Hulan Saas M.D.   On: 09/01/2017 12:12   Ct Head Wo Contrast  Result Date: 09/01/2017 CLINICAL DATA:  Patient status post fall from roof. Lower back pain. Headache. EXAM: CT HEAD WITHOUT CONTRAST CT CERVICAL SPINE WITHOUT CONTRAST TECHNIQUE: Multidetector CT imaging of the head and  cervical spine was performed following the standard protocol without intravenous contrast. Multiplanar CT image reconstructions of the cervical spine were also generated. COMPARISON:  None. FINDINGS: CT HEAD FINDINGS Brain: Ventricles and sulci are appropriate for patient's age. Within the left vertex there is suggestion of a small focus of subarachnoid hemorrhage (image 25; series 4). No evidence for acute cortically based infarct, mass lesion or mass-effect. Vascular: Unremarkable. Skull: Intact.  No displaced fracture. Sinuses/Orbits: Paranasal sinuses are well aerated. Mastoid air cells unremarkable. Orbits are unremarkable. Other: None. CT CERVICAL SPINE FINDINGS Alignment: Normal anatomic alignment. Skull base and vertebrae: Intact. Soft tissues and spinal canal: No prevertebral fluid or swelling. No visible canal hematoma. Disc levels: Relative preservation of the vertebral body and intervertebral disc space heights. No acute cervical spine fracture. Upper chest: There is a displaced fracture through the posterior right first rib. Left apical pneumothorax. Other: None. IMPRESSION: 1. Left apical pneumothorax. 2. Displaced right first rib fracture. 3. Suggestion of small focus of subarachnoid hemorrhage overlying the left vertex.  4. No acute cervical spine fracture. 5. Critical Value/emergent results were called by telephone at the time of interpretation on 09/01/2017 at 2:17 pm to Dr. Rolland Porter , who verbally acknowledged these results. Electronically Signed   By: Annia Belt M.D.   On: 09/01/2017 14:23   Ct Cervical Spine Wo Contrast  Result Date: 09/01/2017 CLINICAL DATA:  Patient status post fall from roof. Lower back pain. Headache. EXAM: CT HEAD WITHOUT CONTRAST CT CERVICAL SPINE WITHOUT CONTRAST TECHNIQUE: Multidetector CT imaging of the head and cervical spine was performed following the standard protocol without intravenous contrast. Multiplanar CT image reconstructions of the cervical spine were  also generated. COMPARISON:  None. FINDINGS: CT HEAD FINDINGS Brain: Ventricles and sulci are appropriate for patient's age. Within the left vertex there is suggestion of a small focus of subarachnoid hemorrhage (image 25; series 4). No evidence for acute cortically based infarct, mass lesion or mass-effect. Vascular: Unremarkable. Skull: Intact.  No displaced fracture. Sinuses/Orbits: Paranasal sinuses are well aerated. Mastoid air cells unremarkable. Orbits are unremarkable. Other: None. CT CERVICAL SPINE FINDINGS Alignment: Normal anatomic alignment. Skull base and vertebrae: Intact. Soft tissues and spinal canal: No prevertebral fluid or swelling. No visible canal hematoma. Disc levels: Relative preservation of the vertebral body and intervertebral disc space heights. No acute cervical spine fracture. Upper chest: There is a displaced fracture through the posterior right first rib. Left apical pneumothorax. Other: None. IMPRESSION: 1. Left apical pneumothorax. 2. Displaced right first rib fracture. 3. Suggestion of small focus of subarachnoid hemorrhage overlying the left vertex. 4. No acute cervical spine fracture. 5. Critical Value/emergent results were called by telephone at the time of interpretation on 09/01/2017 at 2:17 pm to Dr. Rolland Porter , who verbally acknowledged these results. Electronically Signed   By: Annia Belt M.D.   On: 09/01/2017 14:23   Ct Abdomen Pelvis W Contrast  Result Date: 09/01/2017 CLINICAL DATA:  Larey Seat off of roof.  LEFT-sided pain. EXAM: CT ABDOMEN AND PELVIS WITH CONTRAST TECHNIQUE: Multidetector CT imaging of the abdomen and pelvis was performed using the standard protocol following bolus administration of intravenous contrast. CONTRAST:  100 mL ISOVUE-300 IOPAMIDOL (ISOVUE-300) INJECTION 61% COMPARISON:  None pertinent. FINDINGS: Lower chest: BILATERAL pleural effusions. Bibasilar atelectasis. Incompletely visualized LEFT posterior seventh rib fracture, mildly displaced. LEFT  T11 and T12 transverse process fractures. LEFT T12 nondisplaced rib fracture. No pneumothorax is visible. Hepatobiliary: No hepatic injury or perihepatic hematoma. Gallbladder is unremarkable Pancreas: Unremarkable. No pancreatic ductal dilatation or surrounding inflammatory changes. Spleen: No splenic injury or perisplenic hematoma. Adrenals/Urinary Tract: No adrenal hemorrhage or renal injury identified. Bladder is unremarkable. Stomach/Bowel: Stomach is within normal limits. Small hiatal hernia. Appendix appears normal. No evidence of bowel wall thickening, distention, or inflammatory changes. Vascular/Lymphatic: No significant vascular findings are present. No enlarged abdominal or pelvic lymph nodes. Reproductive: Prostate is unremarkable. Other: No abdominal wall hernia or abnormality. No abdominopelvic ascites. Musculoskeletal: Multiple transverse process fractures on the LEFT, L1 through L5. LEFT psoas hematoma. Superficial LEFT posterior flank hematoma. 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 In the lower lumbosacral canal, there is increased attenuation within the ventral epidural space, extending from L4 through S1, maximal at L5. This measures up to 6 mm in thickness. Concern for ventral epidural hematoma. IMPRESSION: No splenic or visceral abdominal injury. Multiple LEFT-sided thoracic and lumbar transverse process fractures, T11 through L5. Fractures of the LEFT T7 and T12 ribs. LEFT psoas and flank hematomas. There is hyperattenuation within the  ventral epidural space from L4 through S1. Early/developing ventral epidural hematoma not excluded. If the patient is stable, MRI of the lumbar spine is recommended for further evaluation. 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 If there is chest wall tenderness above the midthoracic region, CT chest could be performed for further evaluation, to exclude upper rib injury and or pneumothorax. These results were called  by telephone at the time of interpretation on 09/01/2017 at 2:24 pm to Dr. Rolland Porter , who verbally acknowledged these results. Electronically Signed   By: Elsie Stain M.D.   On: 09/01/2017 14:27   Dg Hip Unilat With Pelvis 2-3 Views Left  Result Date: 09/01/2017 CLINICAL DATA:  Fall EXAM: DG HIP (WITH OR WITHOUT PELVIS) 2-3V LEFT COMPARISON:  None. FINDINGS: No acute fracture. No dislocation.  Unremarkable soft tissues. IMPRESSION: No acute bony pathology Electronically Signed   By: Jolaine Click M.D.   On: 09/01/2017 12:10    Procedures Procedures (including critical care time)  Medications Ordered in ED Medications  ondansetron (ZOFRAN-ODT) disintegrating tablet 4 mg (not administered)  fentaNYL (SUBLIMAZE) injection 50 mcg (50 mcg Intravenous Given 09/01/17 1259)  ondansetron (ZOFRAN) injection 4 mg (4 mg Intravenous Given 09/01/17 1207)  iopamidol (ISOVUE-300) 61 % injection (100 mLs  Contrast Given 09/01/17 1328)  iopamidol (ISOVUE-370) 76 % injection (75 mLs  Contrast Given 09/01/17 1513)     Initial Impression / Assessment and Plan / ED Course  I have reviewed the triage vital signs and the nursing notes.  Pertinent labs & imaging results that were available during my care of the patient were reviewed by me and considered in my medical decision making (see chart for details).     Multiple yes imaging studies performed.  Patient has multiple abnormalities.  He has a small vertex subarachnoid hemorrhage.  He does not have additional organized extra-axial fluid or blood collections.  C-spine normal.  Small left apical pneumothorax.  Multiple left-sided rib, and transverse process fractures.  6 mm?  Anterior lumbar epidural hematoma.  Care discussed with trauma surgeon Dr. Cliffton Asters, care discussed with neurosurgical services via their on-call PA.  Patient will be admitted to the care of trauma services with neurosurgical consultation  Final Clinical Impressions(s) / ED Diagnoses   Final  diagnoses:  Multiple trauma    ED Discharge Orders    None       Rolland Porter, MD 09/01/17 1550

## 2017-09-01 NOTE — ED Notes (Signed)
Patient transported to x-ray. ?

## 2017-09-01 NOTE — Consult Note (Signed)
Reason for Consult: multiple TP fxs and epidural hematoma in lumbar spine. Referring Physician: EDP  Roberto Hunter is an 52 y.o. male.   HPI:  Patient presents to the ED today after falling 12 feet off of his roof. States that the roof was wet and he slipped off. He reports some moderate back pain. Denies any NTW or pain down his legs. Denies any headaches, vision changes, N or V. Most of his pain that he complains of is coming from his rib fxs.   Past Medical History:  Diagnosis Date  . Fracture, rib   . Fracture, toe   . Nephrolithiasis 2012    History reviewed. No pertinent surgical history.  No Known Allergies  Social History   Tobacco Use  . Smoking status: Never Smoker  . Smokeless tobacco: Never Used  Substance Use Topics  . Alcohol use: No    Alcohol/week: 0.0 oz    Family History  Problem Relation Age of Onset  . Colon cancer Neg Hx      Review of Systems  Positive ROS: back and rib pain  All other systems have been reviewed and were otherwise negative with the exception of those mentioned in the HPI and as above.  Objective: Vital signs in last 24 hours: Temp:  [97.7 F (36.5 C)] 97.7 F (36.5 C) (03/16 1127) Pulse Rate:  [83-117] 83 (03/16 1630) Resp:  [19] 19 (03/16 1127) BP: (106-120)/(70-82) 106/72 (03/16 1630) SpO2:  [96 %-98 %] 96 % (03/16 1630) Weight:  [68.5 kg (151 lb)] 68.5 kg (151 lb) (03/16 1128)  General Appearance: Alert, cooperative, no distress, appears stated age Head: Normocephalic, without obvious abnormality, atraumatic Eyes: PERRL, conjunctiva/corneas clear, EOM's intact, fundi benign, both eyes      Ears: Normal TM's and external ear canals, both ears Neck: Supple, symmetrical, trachea midline Back: Symmetric, no curvature, ROM normal, no CVA tenderness Lungs:  respirations unlabored Heart: Regular rate and rhythm Extremities: Extremities normal, atraumatic, no cyanosis or edema Pulses: 2+ and symmetric all extremities Skin:  Skin color, texture, turgor normal, no rashes or lesions  NEUROLOGIC:   Mental status: A&O x4, no aphasia, good attention span, Memory and fund of knowledge Motor Exam - grossly normal, normal tone and bulk Sensory Exam - grossly normal Reflexes: symmetric, no pathologic reflexes, No Hoffman's, No clonus Coordination - grossly normal Gait - not tested Balance - not tested Cranial Nerves: I: smell Not tested  II: visual acuity  OS: na    OD: na  II: visual fields Full to confrontation  II: pupils Equal, round, reactive to light  III,VII: ptosis None  III,IV,VI: extraocular muscles  Full ROM  V: mastication   V: facial light touch sensation  Normal  V,VII: corneal reflex  Present  VII: facial muscle function - upper  Normal  VII: facial muscle function - lower Normal  VIII: hearing Not tested  IX: soft palate elevation  Normal  IX,X: gag reflex   XI: trapezius strength  5/5  XI: sternocleidomastoid strength 5/5  XI: neck flexion strength  5/5  XII: tongue strength  Normal    Data Review Lab Results  Component Value Date   WBC 19.0 (H) 09/01/2017   HGB 13.7 09/01/2017   HCT 43.6 09/01/2017   MCV 81.5 09/01/2017   PLT 241 09/01/2017   Lab Results  Component Value Date   NA 139 09/01/2017   K 4.0 09/01/2017   CL 106 09/01/2017   CO2 22 09/01/2017   BUN  14 09/01/2017   CREATININE 1.06 09/01/2017   GLUCOSE 151 (H) 09/01/2017   No results found for: INR, PROTIME  Radiology: Dg Chest 1 View  Result Date: 09/01/2017 CLINICAL DATA:  52 year old who fell approximately 10-12 feet off of his roof earlier today. Left-sided chest pain. Initial encounter. EXAM: CHEST  1 VIEW COMPARISON:  None. FINDINGS: Cardiac silhouette upper normal in size for AP technique. Thoracic aorta mildly tortuous. Hilar and mediastinal contours otherwise unremarkable. Minimal linear atelectasis or scar at the left lung base. Lungs otherwise clear. Pulmonary vascularity normal. No visible pleural  effusions. No pneumothorax. Old healed left rib fractures.  No acute fractures are visible. IMPRESSION: Minimal linear atelectasis or scar at the left lung base. No acute cardiopulmonary disease otherwise. Electronically Signed   By: Hulan Saas M.D.   On: 09/01/2017 12:12   Ct Head Wo Contrast  Result Date: 09/01/2017 CLINICAL DATA:  Patient status post fall from roof. Lower back pain. Headache. EXAM: CT HEAD WITHOUT CONTRAST CT CERVICAL SPINE WITHOUT CONTRAST TECHNIQUE: Multidetector CT imaging of the head and cervical spine was performed following the standard protocol without intravenous contrast. Multiplanar CT image reconstructions of the cervical spine were also generated. COMPARISON:  None. FINDINGS: CT HEAD FINDINGS Brain: Ventricles and sulci are appropriate for patient's age. Within the left vertex there is suggestion of a small focus of subarachnoid hemorrhage (image 25; series 4). No evidence for acute cortically based infarct, mass lesion or mass-effect. Vascular: Unremarkable. Skull: Intact.  No displaced fracture. Sinuses/Orbits: Paranasal sinuses are well aerated. Mastoid air cells unremarkable. Orbits are unremarkable. Other: None. CT CERVICAL SPINE FINDINGS Alignment: Normal anatomic alignment. Skull base and vertebrae: Intact. Soft tissues and spinal canal: No prevertebral fluid or swelling. No visible canal hematoma. Disc levels: Relative preservation of the vertebral body and intervertebral disc space heights. No acute cervical spine fracture. Upper chest: There is a displaced fracture through the posterior right first rib. Left apical pneumothorax. Other: None. IMPRESSION: 1. Left apical pneumothorax. 2. Displaced right first rib fracture. 3. Suggestion of small focus of subarachnoid hemorrhage overlying the left vertex. 4. No acute cervical spine fracture. 5. Critical Value/emergent results were called by telephone at the time of interpretation on 09/01/2017 at 2:17 pm to Dr. Rolland Porter , who verbally acknowledged these results. Electronically Signed   By: Annia Belt M.D.   On: 09/01/2017 14:23   Ct Cervical Spine Wo Contrast  Result Date: 09/01/2017 CLINICAL DATA:  Patient status post fall from roof. Lower back pain. Headache. EXAM: CT HEAD WITHOUT CONTRAST CT CERVICAL SPINE WITHOUT CONTRAST TECHNIQUE: Multidetector CT imaging of the head and cervical spine was performed following the standard protocol without intravenous contrast. Multiplanar CT image reconstructions of the cervical spine were also generated. COMPARISON:  None. FINDINGS: CT HEAD FINDINGS Brain: Ventricles and sulci are appropriate for patient's age. Within the left vertex there is suggestion of a small focus of subarachnoid hemorrhage (image 25; series 4). No evidence for acute cortically based infarct, mass lesion or mass-effect. Vascular: Unremarkable. Skull: Intact.  No displaced fracture. Sinuses/Orbits: Paranasal sinuses are well aerated. Mastoid air cells unremarkable. Orbits are unremarkable. Other: None. CT CERVICAL SPINE FINDINGS Alignment: Normal anatomic alignment. Skull base and vertebrae: Intact. Soft tissues and spinal canal: No prevertebral fluid or swelling. No visible canal hematoma. Disc levels: Relative preservation of the vertebral body and intervertebral disc space heights. No acute cervical spine fracture. Upper chest: There is a displaced fracture through the posterior right first rib. Left  apical pneumothorax. Other: None. IMPRESSION: 1. Left apical pneumothorax. 2. Displaced right first rib fracture. 3. Suggestion of small focus of subarachnoid hemorrhage overlying the left vertex. 4. No acute cervical spine fracture. 5. Critical Value/emergent results were called by telephone at the time of interpretation on 09/01/2017 at 2:17 pm to Dr. Rolland Porter , who verbally acknowledged these results. Electronically Signed   By: Annia Belt M.D.   On: 09/01/2017 14:23   Ct Abdomen Pelvis W  Contrast  Result Date: 09/01/2017 CLINICAL DATA:  Larey Seat off of roof.  LEFT-sided pain. EXAM: CT ABDOMEN AND PELVIS WITH CONTRAST TECHNIQUE: Multidetector CT imaging of the abdomen and pelvis was performed using the standard protocol following bolus administration of intravenous contrast. CONTRAST:  100 mL ISOVUE-300 IOPAMIDOL (ISOVUE-300) INJECTION 61% COMPARISON:  None pertinent. FINDINGS: Lower chest: BILATERAL pleural effusions. Bibasilar atelectasis. Incompletely visualized LEFT posterior seventh rib fracture, mildly displaced. LEFT T11 and T12 transverse process fractures. LEFT T12 nondisplaced rib fracture. No pneumothorax is visible. Hepatobiliary: No hepatic injury or perihepatic hematoma. Gallbladder is unremarkable Pancreas: Unremarkable. No pancreatic ductal dilatation or surrounding inflammatory changes. Spleen: No splenic injury or perisplenic hematoma. Adrenals/Urinary Tract: No adrenal hemorrhage or renal injury identified. Bladder is unremarkable. Stomach/Bowel: Stomach is within normal limits. Small hiatal hernia. Appendix appears normal. No evidence of bowel wall thickening, distention, or inflammatory changes. Vascular/Lymphatic: No significant vascular findings are present. No enlarged abdominal or pelvic lymph nodes. Reproductive: Prostate is unremarkable. Other: No abdominal wall hernia or abnormality. No abdominopelvic ascites. Musculoskeletal: Multiple transverse process fractures on the LEFT, L1 through L5. LEFT psoas hematoma. Superficial LEFT posterior flank hematoma. 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 In the lower lumbosacral canal, there is increased attenuation within the ventral epidural space, extending from L4 through S1, maximal at L5. This measures up to 6 mm in thickness. Concern for ventral epidural hematoma. IMPRESSION: No splenic or visceral abdominal injury. Multiple LEFT-sided thoracic and lumbar transverse process fractures, T11 through L5.  Fractures of the LEFT T7 and T12 ribs. LEFT psoas and flank hematomas. There is hyperattenuation within the ventral epidural space from L4 through S1. Early/developing ventral epidural hematoma not excluded. If the patient is stable, MRI of the lumbar spine is recommended for further evaluation. 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 If there is chest wall tenderness above the midthoracic region, CT chest could be performed for further evaluation, to exclude upper rib injury and or pneumothorax. These results were called by telephone at the time of interpretation on 09/01/2017 at 2:24 pm to Dr. Rolland Porter , who verbally acknowledged these results. Electronically Signed   By: Elsie Stain M.D.   On: 09/01/2017 14:27   Ct Angio Chest Aorta W/cm &/or Wo/cm  Result Date: 09/01/2017 CLINICAL DATA:  Fall from roof. EXAM: CT ANGIOGRAPHY CHEST WITH CONTRAST TECHNIQUE: Multidetector CT imaging of the chest was performed using the standard protocol during bolus administration of intravenous contrast. Multiplanar CT image reconstructions and MIPs were obtained to evaluate the vascular anatomy. CONTRAST:  <See Chart> ISOVUE-370 IOPAMIDOL (ISOVUE-370) INJECTION 76% COMPARISON:  CT abdomen 09/01/2017 FINDINGS: Cardiovascular: No contour abnormality of the thoracic aorta to suggest dissection or transsection. Great vessels are normal. No mediastinal hematoma. No pericardial fluid. Heart is normal. Mediastinum/Nodes: No mediastinal hematoma. Esophagus normal. Central airways normal. Lungs/Pleura: Trace pneumothorax at the LEFT lung base associated with rib fractures. The volume is extremely small along the margin posterior margin the diaphragm on image 81/8. No pulmonary contusion. Mild  pleural thickening and atelectasis at the lung bases. RIGHT upper lobe pulmonary nodule measures 5 mm on image 66/8. Upper Abdomen: No acute injury Musculoskeletal: Minimally displaced LEFT fifth, sixth, and seventh ribs  posteriorly on images 45 through 55/8. Unusual horizontal fracture through the first rib on the RIGHT on image 9/8. Additional nondisplaced RIGHT rib fractures of the posterior 6 rib image 48/8. Transverse process fracture at T 11 on the LEFT image 85, series 8 Review of the MIP images confirms the above findings. IMPRESSION: 1. No evidence of aortic injury. 2. Multiple minimally displaced posterior LEFT rib fractures. Trace pneumothorax associated with these LEFT basilar rib fractures. 3. Unusual horizontal fracture through the RIGHT first rib. 4. Additional nondisplaced fracture of the posterior RIGHT sixth rib. 5. RIGHT upper lobe pulmonary nodule. No follow-up needed if patient is low-risk. Non-contrast chest CT can be considered in 12 months if patient is high-risk. This recommendation follows the consensus statement: Guidelines for Management of Incidental Pulmonary Nodules Detected on CT Images: From the Fleischner Society 2017; Radiology 2017; 284:228-243. Electronically Signed   By: Genevive Bi M.D.   On: 09/01/2017 16:03   Dg Hip Unilat With Pelvis 2-3 Views Left  Result Date: 09/01/2017 CLINICAL DATA:  Fall EXAM: DG HIP (WITH OR WITHOUT PELVIS) 2-3V LEFT COMPARISON:  None. FINDINGS: No acute fracture. No dislocation.  Unremarkable soft tissues. IMPRESSION: No acute bony pathology Electronically Signed   By: Jolaine Click M.D.   On: 09/01/2017 12:10     Assessment/Plan: 53 year old patient comes in today after a fall off of his roof. He sustained a trace left vertex sah, left pneumothorax, left sided rib fxs, multiple TP fxs in his thoracic and lumbar spine along with an epidural hematoma in his spanning from L4-S1. Will order an MRI of lumbar spine for epidural hematoma. Please do not put patient on any blood thinners, lovenox or heparin.    Tiana Loft [facility] 09/01/2017 4:49 PM

## 2017-09-01 NOTE — ED Triage Notes (Signed)
Pt presents to the ed with complaints of falling off of his roof 10-12 feet today. Complaints of pain in his left lower back/hip.  Denies any head injury during the fall. Pt is alert and oriented. Endorses nausea and feeling sleepy.

## 2017-09-01 NOTE — H&P (Addendum)
Activation and Reason: Level 2 trauma consult - fall from roof at 0950  Primary Survey:  Airway: Intact, conversant Breathing: BS bilaterally Circulation: Palpable pulses in all 4 extremities Disability: GCS 15  YAZAN GATLING is an 52 y.o. male.  HPI: Mr. Petrow had a leak in his roof he noticed this morning - water dripping in his house. Was up on the roof investigating it- had called his friend to come over as well. [place] off the roof and his friend arrived. Noted LOC but spontaneously came to on scene. Brought here for further evaluation. Denies any complaints aside from left hip pain today. Denies chest or abdominal pain. Denies chest or neck pain. States c-collar is uncomfortable. Denies any pain in his upper extremities or his RLE. Denies headache, vision problems, weakness.  Past Medical History:  Diagnosis Date  . Fracture, rib   . Fracture, toe   . Nephrolithiasis 2012    History reviewed. No pertinent surgical history.  Family History  Problem Relation Age of Onset  . Colon cancer Neg Hx     Social History:  reports that  has never smoked. he has never used smokeless tobacco. He reports that he does not drink alcohol or use drugs.  Allergies: No Known Allergies  Medications: I have reviewed the patient's current medications.  Results for orders placed or performed during the hospital encounter of 09/01/17 (from the past 48 hour(s))  CBC with Differential/Platelet     Status: Abnormal   Collection Time: 09/01/17 12:22 PM  Result Value Ref Range   WBC 19.0 (H) 4.0 - 10.5 K/uL   RBC 5.35 4.22 - 5.81 MIL/uL   Hemoglobin 13.7 13.0 - 17.0 g/dL   HCT 43.6 39.0 - 52.0 %   MCV 81.5 78.0 - 100.0 fL   MCH 25.6 (L) 26.0 - 34.0 pg   MCHC 31.4 30.0 - 36.0 g/dL   RDW 15.4 11.5 - 15.5 %   Platelets 241 150 - 400 K/uL   Neutrophils Relative % 86 %   Neutro Abs 16.3 1.7 - 7.7 K/uL   Lymphocytes Relative 8 %   Lymphs Abs 1.5 0.7 - 4.0 K/uL   Monocytes Relative 6 %   Monocytes Absolute 1.2 0.1 - 1.0 K/uL   Eosinophils Relative 0 %   Eosinophils Absolute 0.0 0.0 - 0.7 K/uL   Basophils Relative 0 %   Basophils Absolute 0.0 0.0 - 0.1 K/uL    Comment: Performed at [facility], [address] [place], [place] [address]  Basic metabolic panel     Status: Abnormal   Collection Time: 09/01/17 12:22 PM  Result Value Ref Range   Sodium 139 135 - 145 mmol/L   Potassium 4.0 3.5 - 5.1 mmol/L   Chloride 106 101 - 111 mmol/L   CO2 22 22 - 32 mmol/L   Glucose, Bld 151 (H) 65 - 99 mg/dL   BUN 14 6 - 20 mg/dL   Creatinine, Ser 1.06 0.61 - 1.24 mg/dL   Calcium 9.3 8.9 - 10.3 mg/dL   GFR calc non Af Amer >60 >60 mL/min   GFR calc Af Amer >60 >60 mL/min    Comment: (NOTE) The eGFR has been calculated using the CKD EPI equation. This calculation has not been validated in all clinical situations. eGFR's persistently <60 mL/min signify possible Chronic Kidney Disease.    Anion gap 11 5 - 15    Comment: Performed at [facility] [place],  [place] [address]    Dg Chest 1 View  Result Date: 09/01/2017 CLINICAL DATA:  52 year old who fell approximately 10-12 feet off of his roof earlier today. Left-sided chest pain. Initial encounter. EXAM: CHEST  1 VIEW COMPARISON:  None. FINDINGS: Cardiac silhouette upper normal in size for AP technique. Thoracic aorta mildly tortuous. Hilar and mediastinal contours otherwise unremarkable. Minimal linear atelectasis or scar at the left lung base. Lungs otherwise clear. Pulmonary vascularity normal. No visible pleural effusions. No pneumothorax. Old healed left rib fractures.  No acute fractures are visible. IMPRESSION: Minimal linear atelectasis or scar at the left lung base. No acute cardiopulmonary disease otherwise. Electronically Signed   By: Evangeline Dakin M.D.   On: 09/01/2017 12:12   Ct Head Wo Contrast  Result Date: 09/01/2017 CLINICAL DATA:  Patient status post fall from roof. Lower back pain.  Headache. EXAM: CT HEAD WITHOUT CONTRAST CT CERVICAL SPINE WITHOUT CONTRAST TECHNIQUE: Multidetector CT imaging of the head and cervical spine was performed following the standard protocol without intravenous contrast. Multiplanar CT image reconstructions of the cervical spine were also generated. COMPARISON:  None. FINDINGS: CT HEAD FINDINGS Brain: Ventricles and sulci are appropriate for patient's age. Within the left vertex there is suggestion of a small focus of subarachnoid hemorrhage (image 25; series 4). No evidence for acute cortically based infarct, mass lesion or mass-effect. Vascular: Unremarkable. Skull: Intact.  No displaced fracture. Sinuses/Orbits: Paranasal sinuses are well aerated. Mastoid air cells unremarkable. Orbits are unremarkable. Other: None. CT CERVICAL SPINE FINDINGS Alignment: Normal anatomic alignment. Skull base and vertebrae: Intact. Soft tissues and spinal canal: No prevertebral fluid or swelling. No visible canal hematoma. Disc levels: Relative preservation of the vertebral body and intervertebral disc space heights. No acute cervical spine fracture. Upper chest: There is a displaced fracture through the posterior right first rib. Left apical pneumothorax. Other: None. IMPRESSION: 1. Left apical pneumothorax. 2. Displaced right first rib fracture. 3. Suggestion of small focus of subarachnoid hemorrhage overlying the left vertex. 4. No acute cervical spine fracture. 5. Critical Value/emergent results were called by telephone at the time of interpretation on 09/01/2017 at 2:17 pm to Dr. Tanna Furry , who verbally acknowledged these results. Electronically Signed   By: Lovey Newcomer M.D.   On: 09/01/2017 14:23   Ct Cervical Spine Wo Contrast  Result Date: 09/01/2017 CLINICAL DATA:  Patient status post fall from roof. Lower back pain. Headache. EXAM: CT HEAD WITHOUT CONTRAST CT CERVICAL SPINE WITHOUT CONTRAST TECHNIQUE: Multidetector CT imaging of the head and cervical spine was  performed following the standard protocol without intravenous contrast. Multiplanar CT image reconstructions of the cervical spine were also generated. COMPARISON:  None. FINDINGS: CT HEAD FINDINGS Brain: Ventricles and sulci are appropriate for patient's age. Within the left vertex there is suggestion of a small focus of subarachnoid hemorrhage (image 25; series 4). No evidence for acute cortically based infarct, mass lesion or mass-effect. Vascular: Unremarkable. Skull: Intact.  No displaced fracture. Sinuses/Orbits: Paranasal sinuses are well aerated. Mastoid air cells unremarkable. Orbits are unremarkable. Other: None. CT CERVICAL SPINE FINDINGS Alignment: Normal anatomic alignment. Skull base and vertebrae: Intact. Soft tissues and spinal canal: No prevertebral fluid or swelling. No visible canal hematoma. Disc levels: Relative preservation of the vertebral body and intervertebral disc space heights. No acute cervical spine fracture. Upper chest: There is a displaced fracture through the posterior right first rib. Left apical pneumothorax. Other: None. IMPRESSION: 1. Left apical pneumothorax. 2. Displaced right first rib fracture. 3. Suggestion of  small focus of subarachnoid hemorrhage overlying the left vertex. 4. No acute cervical spine fracture. 5. Critical Value/emergent results were called by telephone at the time of interpretation on 09/01/2017 at 2:17 pm to Dr. Tanna Furry , who verbally acknowledged these results. Electronically Signed   By: Lovey Newcomer M.D.   On: 09/01/2017 14:23   Ct Abdomen Pelvis W Contrast  Result Date: 09/01/2017 CLINICAL DATA:  [place] off of roof.  LEFT-sided pain. EXAM: CT ABDOMEN AND PELVIS WITH CONTRAST TECHNIQUE: Multidetector CT imaging of the abdomen and pelvis was performed using the standard protocol following bolus administration of intravenous contrast. CONTRAST:  100 mL ISOVUE-300 IOPAMIDOL (ISOVUE-300) INJECTION 61% COMPARISON:  None pertinent. FINDINGS: Lower chest:  BILATERAL pleural effusions. Bibasilar atelectasis. Incompletely visualized LEFT posterior seventh rib fracture, mildly displaced. LEFT T11 and T12 transverse process fractures. LEFT T12 nondisplaced rib fracture. No pneumothorax is visible. Hepatobiliary: No hepatic injury or perihepatic hematoma. Gallbladder is unremarkable Pancreas: Unremarkable. No pancreatic ductal dilatation or surrounding inflammatory changes. Spleen: No splenic injury or perisplenic hematoma. Adrenals/Urinary Tract: No adrenal hemorrhage or renal injury identified. Bladder is unremarkable. Stomach/Bowel: Stomach is within normal limits. Small hiatal hernia. Appendix appears normal. No evidence of bowel wall thickening, distention, or inflammatory changes. Vascular/Lymphatic: No significant vascular findings are present. No enlarged abdominal or pelvic lymph nodes. Reproductive: Prostate is unremarkable. Other: No abdominal wall hernia or abnormality. No abdominopelvic ascites. Musculoskeletal: Multiple transverse process fractures on the LEFT, L1 through L5. LEFT psoas hematoma. Superficial LEFT posterior flank hematoma. 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 In the lower lumbosacral canal, there is increased attenuation within the ventral epidural space, extending from L4 through S1, maximal at L5. This measures up to 6 mm in thickness. Concern for ventral epidural hematoma. IMPRESSION: No splenic or visceral abdominal injury. Multiple LEFT-sided thoracic and lumbar transverse process fractures, T11 through L5. Fractures of the LEFT T7 and T12 ribs. LEFT psoas and flank hematomas. There is hyperattenuation within the ventral epidural space from L4 through S1. Early/developing ventral epidural hematoma not excluded. If the patient is stable, MRI of the lumbar spine is recommended for further evaluation. 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 If there is chest wall tenderness above the  midthoracic region, CT chest could be performed for further evaluation, to exclude upper rib injury and or pneumothorax. These results were called by telephone at the time of interpretation on 09/01/2017 at 2:24 pm to Dr. Tanna Furry , who verbally acknowledged these results. Electronically Signed   By: Staci Righter M.D.   On: 09/01/2017 14:27   Ct Angio Chest Aorta W/cm &/or Wo/cm  Result Date: 09/01/2017 CLINICAL DATA:  Fall from roof. EXAM: CT ANGIOGRAPHY CHEST WITH CONTRAST TECHNIQUE: Multidetector CT imaging of the chest was performed using the standard protocol during bolus administration of intravenous contrast. Multiplanar CT image reconstructions and MIPs were obtained to evaluate the vascular anatomy. CONTRAST:  <See Chart> ISOVUE-370 IOPAMIDOL (ISOVUE-370) INJECTION 76% COMPARISON:  CT abdomen 09/01/2017 FINDINGS: Cardiovascular: No contour abnormality of the thoracic aorta to suggest dissection or transsection. Great vessels are normal. No mediastinal hematoma. No pericardial fluid. Heart is normal. Mediastinum/Nodes: No mediastinal hematoma. Esophagus normal. Central airways normal. Lungs/Pleura: Trace pneumothorax at the LEFT lung base associated with rib fractures. The volume is extremely small along the margin posterior margin the diaphragm on image 81/8. No pulmonary contusion. Mild pleural thickening and atelectasis at the lung bases. RIGHT upper lobe pulmonary nodule measures 5 mm on image  66/8. Upper Abdomen: No acute injury Musculoskeletal: Minimally displaced LEFT fifth, sixth, and seventh ribs posteriorly on images 45 through 55/8. Unusual horizontal fracture through the first rib on the RIGHT on image 9/8. Additional nondisplaced RIGHT rib fractures of the posterior 6 rib image 48/8. Transverse process fracture at T 11 on the LEFT image 85, series 8 Review of the MIP images confirms the above findings. IMPRESSION: 1. No evidence of aortic injury. 2. Multiple minimally displaced posterior  LEFT rib fractures. Trace pneumothorax associated with these LEFT basilar rib fractures. 3. Unusual horizontal fracture through the RIGHT first rib. 4. Additional nondisplaced fracture of the posterior RIGHT sixth rib. 5. RIGHT upper lobe pulmonary nodule. No follow-up needed if patient is low-risk. Non-contrast chest CT can be considered in 12 months if patient is high-risk. This recommendation follows the consensus statement: Guidelines for Management of Incidental Pulmonary Nodules Detected on CT Images: From the Fleischner Society 2017; Radiology 2017; 284:228-243. Electronically Signed   By: Suzy Bouchard M.D.   On: 09/01/2017 16:03   Dg Hip Unilat With Pelvis 2-3 Views Left  Result Date: 09/01/2017 CLINICAL DATA:  Fall EXAM: DG HIP (WITH OR WITHOUT PELVIS) 2-3V LEFT COMPARISON:  None. FINDINGS: No acute fracture. No dislocation.  Unremarkable soft tissues. IMPRESSION: No acute bony pathology Electronically Signed   By: Marybelle Killings M.D.   On: 09/01/2017 12:10    Review of Systems  Constitutional: Negative for chills and fever.  HENT: Negative for ear discharge, ear pain, hearing loss and nosebleeds.   Eyes: Negative for blurred vision and double vision.  Respiratory: Negative for hemoptysis and shortness of breath.   Cardiovascular: Negative for chest pain and palpitations.  Gastrointestinal: Negative for abdominal pain, nausea and vomiting.  Genitourinary: Negative for dysuria and flank pain.  Musculoskeletal: Positive for joint pain. Negative for back pain.       Per HPI  Skin: Negative for rash.  Neurological: Positive for loss of consciousness. Negative for dizziness, sensory change, speech change, focal weakness, seizures and headaches.  Psychiatric/Behavioral: Negative for hallucinations, memory loss, substance abuse and suicidal ideas.   Blood pressure 108/74, pulse 83, temperature 97.7 F (36.5 C), resp. rate 19, weight 68.5 kg (151 lb), SpO2 98 %. Physical Exam    Constitutional: He is oriented to person, place, and time. He appears well-developed and well-nourished.  HENT:  Head: Normocephalic and atraumatic.  Eyes: Conjunctivae and EOM are normal. Pupils are equal, round, and reactive to light.  Neck: Neck supple.  c collar in place  Cardiovascular: Normal rate and regular rhythm.  Respiratory: Effort normal and breath sounds normal.  GI: Soft. There is no tenderness. There is no rebound and no guarding.  Genitourinary: Penis normal. No penile tenderness.  Musculoskeletal: Normal range of motion. He exhibits no deformity.  Neurological: He is alert and oriented to person, place, and time.  Skin: Skin is warm and dry.  Psychiatric: He has a normal mood and affect. His behavior is normal.     Injury Summary -Occult L apical ptx -Displaced R first rib fx -Small SAH overlying L vertex -Multiple left thoracic and lumbar TP fxs; T11-L5 -Fx R 1st rib; Left 5-7 ribs -Left psoas and flank hematoma -Hyperattentuation in ventral epidural space from L4-S1  PLAN -Consult neurosurgery for mgmt of small SAH, epidural hematoma and multiple TP fxs - recommended MRI L-spine to further evaluate -Admit for pulmonary toilet, incentive spirometry and pain control -AM CXR -SCDs; spine precautions pending MRI lumbar spine -No chemical  DVT prophylaxis as per neurosurgery -Tertiary evaluation tomorrow  Sharon Mt. Dema Severin, M.D. [facility] 09/01/2017, 5:23 PM

## 2017-09-01 NOTE — ED Notes (Signed)
On this rn assessment the patient appears to be pale and nauseous, repeat blood pressure is 89/33 pt taken straight back to room.

## 2017-09-02 ENCOUNTER — Inpatient Hospital Stay (HOSPITAL_COMMUNITY): Payer: Commercial Managed Care - PPO

## 2017-09-02 LAB — BASIC METABOLIC PANEL
ANION GAP: 7 (ref 5–15)
BUN: 12 mg/dL (ref 6–20)
CHLORIDE: 107 mmol/L (ref 101–111)
CO2: 25 mmol/L (ref 22–32)
Calcium: 8.6 mg/dL — ABNORMAL LOW (ref 8.9–10.3)
Creatinine, Ser: 0.77 mg/dL (ref 0.61–1.24)
GFR calc non Af Amer: >60 mL/min (ref >60)
Glucose, Bld: 107 mg/dL — ABNORMAL HIGH (ref 65–99)
Potassium: 3.7 mmol/L (ref 3.5–5.1)
Sodium: 139 mmol/L (ref 135–145)

## 2017-09-02 LAB — HIV ANTIBODY (ROUTINE TESTING W REFLEX): HIV Screen 4th Generation wRfx: NONREACTIVE

## 2017-09-02 LAB — CBC
HEMATOCRIT: 37.2 % — AB (ref 39.0–52.0)
HEMOGLOBIN: 11.7 g/dL — AB (ref 13.0–17.0)
MCH: 25.2 pg — ABNORMAL LOW (ref 26.0–34.0)
MCHC: 31.5 g/dL (ref 30.0–36.0)
MCV: 80.2 fL (ref 78.0–100.0)
Platelets: 221 10*3/uL (ref 150–400)
RBC: 4.64 MIL/uL (ref 4.22–5.81)
RDW: 15.1 % (ref 11.5–15.5)
WBC: 9.8 10*3/uL (ref 4.0–10.5)

## 2017-09-02 MED ORDER — METHOCARBAMOL 750 MG PO TABS
750.0000 mg | ORAL_TABLET | Freq: Three times a day (TID) | ORAL | Status: DC | PRN
  Administered 2017-09-02 – 2017-09-03 (×2): 750 mg via ORAL
  Filled 2017-09-02 (×2): qty 1

## 2017-09-02 NOTE — Progress Notes (Signed)
Pt transfer to radiology

## 2017-09-02 NOTE — Progress Notes (Signed)
Subjective: Patient reports patient seems to be doing well some chest wall pain but no leg plane and no numbness and tingling in his feet.  Objective: Vital signs in last 24 hours: Temp:  [97.7 F (36.5 C)-99.3 F (37.4 C)] 98.3 F (36.8 C) (03/17 0546) Pulse Rate:  [70-117] 71 (03/17 0546) Resp:  [16-19] 16 (03/17 0546) BP: (95-121)/(62-82) 108/71 (03/17 0546) SpO2:  [95 %-100 %] 100 % (03/17 0546) Weight:  [68.5 kg (151 lb)] 68.5 kg (151 lb) (03/16 1128)  Intake/Output from previous day: 03/16 0701 - 03/17 0700 In: 983.3 [I.V.:983.3] Out: 550 [Urine:550] Intake/Output this shift: Total I/O In: 983.3 [I.V.:983.3] Out: 200 [Urine:200]  Strength 5 out of 5 in his lower extremities bilaterally  Lab Results: Recent Labs    09/01/17 1222  WBC 19.0*  HGB 13.7  HCT 43.6  PLT 241   BMET Recent Labs    09/01/17 1222  NA 139  K 4.0  CL 106  CO2 22  GLUCOSE 151*  BUN 14  CREATININE 1.06  CALCIUM 9.3    Studies/Results: Dg Chest 1 View  Result Date: 09/01/2017 CLINICAL DATA:  52 year old who fell approximately 10-12 feet off of his roof earlier today. Left-sided chest pain. Initial encounter. EXAM: CHEST  1 VIEW COMPARISON:  None. FINDINGS: Cardiac silhouette upper normal in size for AP technique. Thoracic aorta mildly tortuous. Hilar and mediastinal contours otherwise unremarkable. Minimal linear atelectasis or scar at the left lung base. Lungs otherwise clear. Pulmonary vascularity normal. No visible pleural effusions. No pneumothorax. Old healed left rib fractures.  No acute fractures are visible. IMPRESSION: Minimal linear atelectasis or scar at the left lung base. No acute cardiopulmonary disease otherwise. Electronically Signed   By: Hulan Saas M.D.   On: 09/01/2017 12:12   Ct Head Wo Contrast  Result Date: 09/01/2017 CLINICAL DATA:  Patient status post fall from roof. Lower back pain. Headache. EXAM: CT HEAD WITHOUT CONTRAST CT CERVICAL SPINE WITHOUT CONTRAST  TECHNIQUE: Multidetector CT imaging of the head and cervical spine was performed following the standard protocol without intravenous contrast. Multiplanar CT image reconstructions of the cervical spine were also generated. COMPARISON:  None. FINDINGS: CT HEAD FINDINGS Brain: Ventricles and sulci are appropriate for patient's age. Within the left vertex there is suggestion of a small focus of subarachnoid hemorrhage (image 25; series 4). No evidence for acute cortically based infarct, mass lesion or mass-effect. Vascular: Unremarkable. Skull: Intact.  No displaced fracture. Sinuses/Orbits: Paranasal sinuses are well aerated. Mastoid air cells unremarkable. Orbits are unremarkable. Other: None. CT CERVICAL SPINE FINDINGS Alignment: Normal anatomic alignment. Skull base and vertebrae: Intact. Soft tissues and spinal canal: No prevertebral fluid or swelling. No visible canal hematoma. Disc levels: Relative preservation of the vertebral body and intervertebral disc space heights. No acute cervical spine fracture. Upper chest: There is a displaced fracture through the posterior right first rib. Left apical pneumothorax. Other: None. IMPRESSION: 1. Left apical pneumothorax. 2. Displaced right first rib fracture. 3. Suggestion of small focus of subarachnoid hemorrhage overlying the left vertex. 4. No acute cervical spine fracture. 5. Critical Value/emergent results were called by telephone at the time of interpretation on 09/01/2017 at 2:17 pm to Dr. Rolland Porter , who verbally acknowledged these results. Electronically Signed   By: Annia Belt M.D.   On: 09/01/2017 14:23   Ct Cervical Spine Wo Contrast  Result Date: 09/01/2017 CLINICAL DATA:  Patient status post fall from roof. Lower back pain. Headache. EXAM: CT HEAD WITHOUT CONTRAST CT  CERVICAL SPINE WITHOUT CONTRAST TECHNIQUE: Multidetector CT imaging of the head and cervical spine was performed following the standard protocol without intravenous contrast. Multiplanar  CT image reconstructions of the cervical spine were also generated. COMPARISON:  None. FINDINGS: CT HEAD FINDINGS Brain: Ventricles and sulci are appropriate for patient's age. Within the left vertex there is suggestion of a small focus of subarachnoid hemorrhage (image 25; series 4). No evidence for acute cortically based infarct, mass lesion or mass-effect. Vascular: Unremarkable. Skull: Intact.  No displaced fracture. Sinuses/Orbits: Paranasal sinuses are well aerated. Mastoid air cells unremarkable. Orbits are unremarkable. Other: None. CT CERVICAL SPINE FINDINGS Alignment: Normal anatomic alignment. Skull base and vertebrae: Intact. Soft tissues and spinal canal: No prevertebral fluid or swelling. No visible canal hematoma. Disc levels: Relative preservation of the vertebral body and intervertebral disc space heights. No acute cervical spine fracture. Upper chest: There is a displaced fracture through the posterior right first rib. Left apical pneumothorax. Other: None. IMPRESSION: 1. Left apical pneumothorax. 2. Displaced right first rib fracture. 3. Suggestion of small focus of subarachnoid hemorrhage overlying the left vertex. 4. No acute cervical spine fracture. 5. Critical Value/emergent results were called by telephone at the time of interpretation on 09/01/2017 at 2:17 pm to Dr. Rolland Porter , who verbally acknowledged these results. Electronically Signed   By: Annia Belt M.D.   On: 09/01/2017 14:23   Mr Lumbar Spine Wo Contrast  Result Date: 09/01/2017 CLINICAL DATA:  52 year old male status post 10-12 foot fall from roof. Lumbar back pain radiating to the left hip. Nausea. Left side thoracic and lumbar transverse process fractures T11 through L5 on CT today. Possible ventral epidural hematoma from L4 to the sacrum. EXAM: MRI LUMBAR SPINE WITHOUT CONTRAST TECHNIQUE: Multiplanar, multisequence MR imaging of the lumbar spine was performed. No intravenous contrast was administered. COMPARISON:  CT  Abdomen and Pelvis 1339 hours today. FINDINGS: Segmentation:  Normal on the CT comparison. Alignment: Stable, mild straightening of lumbar lordosis. Mild grade 1 anterolisthesis of L5 on S1. Vertebrae: The mildly displaced left transverse process fractures throughout the lumbar spine are poorly visible on MRI. There is associated left paraspinal muscle edema along the level of the injuries best seen on series 3, image 15. Normal vertebral body marrow signal from the inferior T11 through the L4 level. The visible sacrum and SI joints appear intact. There is patchy and serpiginous marrow signal heterogeneity in the L5 vertebral body to the right of midline. This is not correlated on the earlier CT. Conus medullaris and cauda equina: Conus extends to the L1 level. Normal visible lower thoracic spinal cord and conus. The cauda equina nerve roots also appear normal. The epidural spaces are normal throughout the lumbar spine. Paraspinal and other soft tissues: Trace fluid tracking along the left psoas musculature laterally (series 5, image 15) associated with posterior ileo psoas muscle edema. Mild left erector spinae muscle edema in keeping with the left transverse process fractures. Small left oblique abdominal muscle hematoma is partially visible on series 5, image 29. Negative visible abdominal viscera. The right paraspinal soft tissues appear normal. Disc levels: No lower thoracic or lumbar spinal stenosis. There is disc bulging at L3-L4 and L5-S1, with vacuum disc at the latter, but no discrete disc herniation. Chronic L5 pars fractures are associated with mild anterolisthesis of L5 on S1. There is mild to moderate L4-L5 through L5-S1 facet hypertrophy. There is associated mild bilateral L4 and L5 neural foraminal stenosis which appears greater on the left. IMPRESSION:  1. The left side lumbar transverse process fractures are poorly visible on MRI, but the associated left paraspinal muscle injury with edema, trace  fluid, and occasional small intramuscular hematoma is well demonstrated. 2. Negative for lumbar epidural hematoma. No lumbar disc herniation or spinal stenosis. 3. Heterogeneous signal within the L5 vertebral body probably represents benign vertebral hemangioma rather than a posttraumatic vertebral body contusion. There is no lumbar vertebral compression fracture. 4. 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 Mild degenerative L4 and L5 neural foraminal stenosis. Electronically Signed   By: Odessa FlemingH  Hall M.D.   On: 09/01/2017 21:46   Ct Abdomen Pelvis W Contrast  Result Date: 09/01/2017 CLINICAL DATA:  Larey SeatFell off of roof.  LEFT-sided pain. EXAM: CT ABDOMEN AND PELVIS WITH CONTRAST TECHNIQUE: Multidetector CT imaging of the abdomen and pelvis was performed using the standard protocol following bolus administration of intravenous contrast. CONTRAST:  100 mL ISOVUE-300 IOPAMIDOL (ISOVUE-300) INJECTION 61% COMPARISON:  None pertinent. FINDINGS: Lower chest: BILATERAL pleural effusions. Bibasilar atelectasis. Incompletely visualized LEFT posterior seventh rib fracture, mildly displaced. LEFT T11 and T12 transverse process fractures. LEFT T12 nondisplaced rib fracture. No pneumothorax is visible. Hepatobiliary: No hepatic injury or perihepatic hematoma. Gallbladder is unremarkable Pancreas: Unremarkable. No pancreatic ductal dilatation or surrounding inflammatory changes. Spleen: No splenic injury or perisplenic hematoma. Adrenals/Urinary Tract: No adrenal hemorrhage or renal injury identified. Bladder is unremarkable. Stomach/Bowel: Stomach is within normal limits. Small hiatal hernia. Appendix appears normal. No evidence of bowel wall thickening, distention, or inflammatory changes. Vascular/Lymphatic: No significant vascular findings are present. No enlarged abdominal or pelvic lymph nodes. Reproductive: Prostate is unremarkable. Other: No abdominal wall hernia or abnormality. No abdominopelvic ascites.  Musculoskeletal: Multiple transverse process fractures on the LEFT, L1 through L5. LEFT psoas hematoma. Superficial LEFT posterior flank hematoma. 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 In the lower lumbosacral canal, there is increased attenuation within the ventral epidural space, extending from L4 through S1, maximal at L5. This measures up to 6 mm in thickness. Concern for ventral epidural hematoma. IMPRESSION: No splenic or visceral abdominal injury. Multiple LEFT-sided thoracic and lumbar transverse process fractures, T11 through L5. Fractures of the LEFT T7 and T12 ribs. LEFT psoas and flank hematomas. There is hyperattenuation within the ventral epidural space from L4 through S1. Early/developing ventral epidural hematoma not excluded. If the patient is stable, MRI of the lumbar spine is recommended for further evaluation. 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 If there is chest wall tenderness above the midthoracic region, CT chest could be performed for further evaluation, to exclude upper rib injury and or pneumothorax. These results were called by telephone at the time of interpretation on 09/01/2017 at 2:24 pm to Dr. Rolland PorterMARK JAMES , who verbally acknowledged these results. Electronically Signed   By: Elsie StainJohn T Curnes M.D.   On: 09/01/2017 14:27   Ct Angio Chest Aorta W/cm &/or Wo/cm  Result Date: 09/01/2017 CLINICAL DATA:  Fall from roof. EXAM: CT ANGIOGRAPHY CHEST WITH CONTRAST TECHNIQUE: Multidetector CT imaging of the chest was performed using the standard protocol during bolus administration of intravenous contrast. Multiplanar CT image reconstructions and MIPs were obtained to evaluate the vascular anatomy. CONTRAST:  <See Chart> ISOVUE-370 IOPAMIDOL (ISOVUE-370) INJECTION 76% COMPARISON:  CT abdomen 09/01/2017 FINDINGS: Cardiovascular: No contour abnormality of the thoracic aorta to suggest dissection or transsection. Great vessels are normal. No  mediastinal hematoma. No pericardial fluid. Heart is normal. Mediastinum/Nodes: No mediastinal hematoma. Esophagus normal. Central airways normal. Lungs/Pleura: Trace  pneumothorax at the LEFT lung base associated with rib fractures. The volume is extremely small along the margin posterior margin the diaphragm on image 81/8. No pulmonary contusion. Mild pleural thickening and atelectasis at the lung bases. RIGHT upper lobe pulmonary nodule measures 5 mm on image 66/8. Upper Abdomen: No acute injury Musculoskeletal: Minimally displaced LEFT fifth, sixth, and seventh ribs posteriorly on images 45 through 55/8. Unusual horizontal fracture through the first rib on the RIGHT on image 9/8. Additional nondisplaced RIGHT rib fractures of the posterior 6 rib image 48/8. Transverse process fracture at T 11 on the LEFT image 85, series 8 Review of the MIP images confirms the above findings. IMPRESSION: 1. No evidence of aortic injury. 2. Multiple minimally displaced posterior LEFT rib fractures. Trace pneumothorax associated with these LEFT basilar rib fractures. 3. Unusual horizontal fracture through the RIGHT first rib. 4. Additional nondisplaced fracture of the posterior RIGHT sixth rib. 5. RIGHT upper lobe pulmonary nodule. No follow-up needed if patient is low-risk. Non-contrast chest CT can be considered in 12 months if patient is high-risk. This recommendation follows the consensus statement: Guidelines for Management of Incidental Pulmonary Nodules Detected on CT Images: From the Fleischner Society 2017; Radiology 2017; 284:228-243. Electronically Signed   By: Genevive Bi M.D.   On: 09/01/2017 16:03   Dg Hip Unilat With Pelvis 2-3 Views Left  Result Date: 09/01/2017 CLINICAL DATA:  Fall EXAM: DG HIP (WITH OR WITHOUT PELVIS) 2-3V LEFT COMPARISON:  None. FINDINGS: No acute fracture. No dislocation.  Unremarkable soft tissues. IMPRESSION: No acute bony pathology Electronically Signed   By: Jolaine Click M.D.   On:  09/01/2017 12:10    Assessment/Plan: Hospital day 1 lumbar transverse process fractures and small ventral epidural hematoma. Patient remains a sudden dramatic. Mobilize per traumaMRI negative for epidural hematoma. C-spine CT negative for fracture. Recommend cervical flexion extension x-rays and then if those are negative can DC [place] collar.  LOS: 1 day     Abbygail [place] P 09/02/2017, 6:39 AM

## 2017-09-02 NOTE — Progress Notes (Signed)
Subjective/Chief Complaint: No c/o No sob. Pain ok. Discomfort when up   Objective: Vital signs in last 24 hours: Temp:  [97.7 F (36.5 C)-99.3 F (37.4 C)] 98.3 F (36.8 C) (03/17 0546) Pulse Rate:  [70-117] 71 (03/17 0546) Resp:  [16-19] 16 (03/17 0546) BP: (95-121)/(62-82) 108/71 (03/17 0546) SpO2:  [95 %-100 %] 100 % (03/17 0546) Weight:  [68.5 kg (151 lb)] 68.5 kg (151 lb) (03/16 1128) Last BM Date: 09/01/17  Intake/Output from previous day: 03/16 0701 - 03/17 0700 In: 983.3 [I.V.:983.3] Out: 550 [Urine:550] Intake/Output this shift: No intake/output data recorded.  Alert, nad, nontoxic gcs 15 Pupils equal, MAE cta b/l IS 2200 Reg Soft, nt, nd +collar No palp tenderness on extremities +SCDs  Lab Results:  Recent Labs    09/01/17 1222 09/02/17 0709  WBC 19.0* 9.8  HGB 13.7 11.7*  HCT 43.6 37.2*  PLT 241 221   BMET Recent Labs    09/01/17 1222 09/02/17 0709  NA 139 139  K 4.0 3.7  CL 106 107  CO2 22 25  GLUCOSE 151* 107*  BUN 14 12  CREATININE 1.06 0.77  CALCIUM 9.3 8.6*   PT/INR No results for input(s): LABPROT, INR in the last 72 hours. ABG No results for input(s): PHART, HCO3 in the last 72 hours.  Invalid input(s): PCO2, PO2  Studies/Results: Dg Chest 1 View  Result Date: 09/01/2017 CLINICAL DATA:  52 year old who fell approximately 10-12 feet off of his roof earlier today. Left-sided chest pain. Initial encounter. EXAM: CHEST  1 VIEW COMPARISON:  None. FINDINGS: Cardiac silhouette upper normal in size for AP technique. Thoracic aorta mildly tortuous. Hilar and mediastinal contours otherwise unremarkable. Minimal linear atelectasis or scar at the left lung base. Lungs otherwise clear. Pulmonary vascularity normal. No visible pleural effusions. No pneumothorax. Old healed left rib fractures.  No acute fractures are visible. IMPRESSION: Minimal linear atelectasis or scar at the left lung base. No acute cardiopulmonary disease otherwise.  Electronically Signed   By: Hulan Saas M.D.   On: 09/01/2017 12:12   Ct Head Wo Contrast  Result Date: 09/01/2017 CLINICAL DATA:  Patient status post fall from roof. Lower back pain. Headache. EXAM: CT HEAD WITHOUT CONTRAST CT CERVICAL SPINE WITHOUT CONTRAST TECHNIQUE: Multidetector CT imaging of the head and cervical spine was performed following the standard protocol without intravenous contrast. Multiplanar CT image reconstructions of the cervical spine were also generated. COMPARISON:  None. FINDINGS: CT HEAD FINDINGS Brain: Ventricles and sulci are appropriate for patient's age. Within the left vertex there is suggestion of a small focus of subarachnoid hemorrhage (image 25; series 4). No evidence for acute cortically based infarct, mass lesion or mass-effect. Vascular: Unremarkable. Skull: Intact.  No displaced fracture. Sinuses/Orbits: Paranasal sinuses are well aerated. Mastoid air cells unremarkable. Orbits are unremarkable. Other: None. CT CERVICAL SPINE FINDINGS Alignment: Normal anatomic alignment. Skull base and vertebrae: Intact. Soft tissues and spinal canal: No prevertebral fluid or swelling. No visible canal hematoma. Disc levels: Relative preservation of the vertebral body and intervertebral disc space heights. No acute cervical spine fracture. Upper chest: There is a displaced fracture through the posterior right first rib. Left apical pneumothorax. Other: None. IMPRESSION: 1. Left apical pneumothorax. 2. Displaced right first rib fracture. 3. Suggestion of small focus of subarachnoid hemorrhage overlying the left vertex. 4. No acute cervical spine fracture. 5. Critical Value/emergent results were called by telephone at the time of interpretation on 09/01/2017 at 2:17 pm to Dr. Rolland Porter , who verbally  acknowledged these results. Electronically Signed   By: Annia Belt M.D.   On: 09/01/2017 14:23   Ct Cervical Spine Wo Contrast  Result Date: 09/01/2017 CLINICAL DATA:  Patient status  post fall from roof. Lower back pain. Headache. EXAM: CT HEAD WITHOUT CONTRAST CT CERVICAL SPINE WITHOUT CONTRAST TECHNIQUE: Multidetector CT imaging of the head and cervical spine was performed following the standard protocol without intravenous contrast. Multiplanar CT image reconstructions of the cervical spine were also generated. COMPARISON:  None. FINDINGS: CT HEAD FINDINGS Brain: Ventricles and sulci are appropriate for patient's age. Within the left vertex there is suggestion of a small focus of subarachnoid hemorrhage (image 25; series 4). No evidence for acute cortically based infarct, mass lesion or mass-effect. Vascular: Unremarkable. Skull: Intact.  No displaced fracture. Sinuses/Orbits: Paranasal sinuses are well aerated. Mastoid air cells unremarkable. Orbits are unremarkable. Other: None. CT CERVICAL SPINE FINDINGS Alignment: Normal anatomic alignment. Skull base and vertebrae: Intact. Soft tissues and spinal canal: No prevertebral fluid or swelling. No visible canal hematoma. Disc levels: Relative preservation of the vertebral body and intervertebral disc space heights. No acute cervical spine fracture. Upper chest: There is a displaced fracture through the posterior right first rib. Left apical pneumothorax. Other: None. IMPRESSION: 1. Left apical pneumothorax. 2. Displaced right first rib fracture. 3. Suggestion of small focus of subarachnoid hemorrhage overlying the left vertex. 4. No acute cervical spine fracture. 5. Critical Value/emergent results were called by telephone at the time of interpretation on 09/01/2017 at 2:17 pm to Dr. Rolland Porter , who verbally acknowledged these results. Electronically Signed   By: Annia Belt M.D.   On: 09/01/2017 14:23   Mr Lumbar Spine Wo Contrast  Result Date: 09/01/2017 CLINICAL DATA:  52 year old male status post 10-12 foot fall from roof. Lumbar back pain radiating to the left hip. Nausea. Left side thoracic and lumbar transverse process fractures T11  through L5 on CT today. Possible ventral epidural hematoma from L4 to the sacrum. EXAM: MRI LUMBAR SPINE WITHOUT CONTRAST TECHNIQUE: Multiplanar, multisequence MR imaging of the lumbar spine was performed. No intravenous contrast was administered. COMPARISON:  CT Abdomen and Pelvis 1339 hours today. FINDINGS: Segmentation:  Normal on the CT comparison. Alignment: Stable, mild straightening of lumbar lordosis. Mild grade 1 anterolisthesis of L5 on S1. Vertebrae: The mildly displaced left transverse process fractures throughout the lumbar spine are poorly visible on MRI. There is associated left paraspinal muscle edema along the level of the injuries best seen on series 3, image 15. Normal vertebral body marrow signal from the inferior T11 through the L4 level. The visible sacrum and SI joints appear intact. There is patchy and serpiginous marrow signal heterogeneity in the L5 vertebral body to the right of midline. This is not correlated on the earlier CT. Conus medullaris and cauda equina: Conus extends to the L1 level. Normal visible lower thoracic spinal cord and conus. The cauda equina nerve roots also appear normal. The epidural spaces are normal throughout the lumbar spine. Paraspinal and other soft tissues: Trace fluid tracking along the left psoas musculature laterally (series 5, image 15) associated with posterior ileo psoas muscle edema. Mild left erector spinae muscle edema in keeping with the left transverse process fractures. Small left oblique abdominal muscle hematoma is partially visible on series 5, image 29. Negative visible abdominal viscera. The right paraspinal soft tissues appear normal. Disc levels: No lower thoracic or lumbar spinal stenosis. There is disc bulging at L3-L4 and L5-S1, with vacuum disc at the  latter, but no discrete disc herniation. Chronic L5 pars fractures are associated with mild anterolisthesis of L5 on S1. There is mild to moderate L4-L5 through L5-S1 facet hypertrophy.  There is associated mild bilateral L4 and L5 neural foraminal stenosis which appears greater on the left. IMPRESSION: 1. The left side lumbar transverse process fractures are poorly visible on MRI, but the associated left paraspinal muscle injury with edema, trace fluid, and occasional small intramuscular hematoma is well demonstrated. 2. Negative for lumbar epidural hematoma. No lumbar disc herniation or spinal stenosis. 3. Heterogeneous signal within the L5 vertebral body probably represents benign vertebral hemangioma rather than a posttraumatic vertebral body contusion. There is no lumbar vertebral compression fracture. 4. 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 Mild degenerative L4 and L5 neural foraminal stenosis. Electronically Signed   By: Odessa Fleming M.D.   On: 09/01/2017 21:46   Ct Abdomen Pelvis W Contrast  Result Date: 09/01/2017 CLINICAL DATA:  Larey Seat off of roof.  LEFT-sided pain. EXAM: CT ABDOMEN AND PELVIS WITH CONTRAST TECHNIQUE: Multidetector CT imaging of the abdomen and pelvis was performed using the standard protocol following bolus administration of intravenous contrast. CONTRAST:  100 mL ISOVUE-300 IOPAMIDOL (ISOVUE-300) INJECTION 61% COMPARISON:  None pertinent. FINDINGS: Lower chest: BILATERAL pleural effusions. Bibasilar atelectasis. Incompletely visualized LEFT posterior seventh rib fracture, mildly displaced. LEFT T11 and T12 transverse process fractures. LEFT T12 nondisplaced rib fracture. No pneumothorax is visible. Hepatobiliary: No hepatic injury or perihepatic hematoma. Gallbladder is unremarkable Pancreas: Unremarkable. No pancreatic ductal dilatation or surrounding inflammatory changes. Spleen: No splenic injury or perisplenic hematoma. Adrenals/Urinary Tract: No adrenal hemorrhage or renal injury identified. Bladder is unremarkable. Stomach/Bowel: Stomach is within normal limits. Small hiatal hernia. Appendix appears normal. No evidence of bowel wall  thickening, distention, or inflammatory changes. Vascular/Lymphatic: No significant vascular findings are present. No enlarged abdominal or pelvic lymph nodes. Reproductive: Prostate is unremarkable. Other: No abdominal wall hernia or abnormality. No abdominopelvic ascites. Musculoskeletal: Multiple transverse process fractures on the LEFT, L1 through L5. LEFT psoas hematoma. Superficial LEFT posterior flank hematoma. 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 In the lower lumbosacral canal, there is increased attenuation within the ventral epidural space, extending from L4 through S1, maximal at L5. This measures up to 6 mm in thickness. Concern for ventral epidural hematoma. IMPRESSION: No splenic or visceral abdominal injury. Multiple LEFT-sided thoracic and lumbar transverse process fractures, T11 through L5. Fractures of the LEFT T7 and T12 ribs. LEFT psoas and flank hematomas. There is hyperattenuation within the ventral epidural space from L4 through S1. Early/developing ventral epidural hematoma not excluded. If the patient is stable, MRI of the lumbar spine is recommended for further evaluation. 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 If there is chest wall tenderness above the midthoracic region, CT chest could be performed for further evaluation, to exclude upper rib injury and or pneumothorax. These results were called by telephone at the time of interpretation on 09/01/2017 at 2:24 pm to Dr. Rolland Porter , who verbally acknowledged these results. Electronically Signed   By: Elsie Stain M.D.   On: 09/01/2017 14:27   Dg Chest Port 1 View  Result Date: 09/02/2017 CLINICAL DATA:  Trauma.  Rib fractures and pneumothorax. EXAM: PORTABLE CHEST 1 VIEW COMPARISON:  Chest CT 09/01/2017 FINDINGS: Normal mediastinum and cardiac silhouette. Multiple posterior LEFT rib fractures noted. Small pneumothorax identified on comparison CT is not appreciable. No RIGHT  pneumothorax. No pulmonary contusion evident. IMPRESSION: 1. No pneumothorax  appreciable. 2. Multiple posterior LEFT rib fractures. Electronically Signed   By: Genevive BiStewart  Edmunds M.D.   On: 09/02/2017 08:37   Ct Angio Chest Aorta W/cm &/or Wo/cm  Result Date: 09/01/2017 CLINICAL DATA:  Fall from roof. EXAM: CT ANGIOGRAPHY CHEST WITH CONTRAST TECHNIQUE: Multidetector CT imaging of the chest was performed using the standard protocol during bolus administration of intravenous contrast. Multiplanar CT image reconstructions and MIPs were obtained to evaluate the vascular anatomy. CONTRAST:  <See Chart> ISOVUE-370 IOPAMIDOL (ISOVUE-370) INJECTION 76% COMPARISON:  CT abdomen 09/01/2017 FINDINGS: Cardiovascular: No contour abnormality of the thoracic aorta to suggest dissection or transsection. Great vessels are normal. No mediastinal hematoma. No pericardial fluid. Heart is normal. Mediastinum/Nodes: No mediastinal hematoma. Esophagus normal. Central airways normal. Lungs/Pleura: Trace pneumothorax at the LEFT lung base associated with rib fractures. The volume is extremely small along the margin posterior margin the diaphragm on image 81/8. No pulmonary contusion. Mild pleural thickening and atelectasis at the lung bases. RIGHT upper lobe pulmonary nodule measures 5 mm on image 66/8. Upper Abdomen: No acute injury Musculoskeletal: Minimally displaced LEFT fifth, sixth, and seventh ribs posteriorly on images 45 through 55/8. Unusual horizontal fracture through the first rib on the RIGHT on image 9/8. Additional nondisplaced RIGHT rib fractures of the posterior 6 rib image 48/8. Transverse process fracture at T 11 on the LEFT image 85, series 8 Review of the MIP images confirms the above findings. IMPRESSION: 1. No evidence of aortic injury. 2. Multiple minimally displaced posterior LEFT rib fractures. Trace pneumothorax associated with these LEFT basilar rib fractures. 3. Unusual horizontal fracture through the RIGHT  first rib. 4. Additional nondisplaced fracture of the posterior RIGHT sixth rib. 5. RIGHT upper lobe pulmonary nodule. No follow-up needed if patient is low-risk. Non-contrast chest CT can be considered in 12 months if patient is high-risk. This recommendation follows the consensus statement: Guidelines for Management of Incidental Pulmonary Nodules Detected on CT Images: From the Fleischner Society 2017; Radiology 2017; 284:228-243. Electronically Signed   By: Genevive BiStewart  Edmunds M.D.   On: 09/01/2017 16:03   Dg Hip Unilat With Pelvis 2-3 Views Left  Result Date: 09/01/2017 CLINICAL DATA:  Fall EXAM: DG HIP (WITH OR WITHOUT PELVIS) 2-3V LEFT COMPARISON:  None. FINDINGS: No acute fracture. No dislocation.  Unremarkable soft tissues. IMPRESSION: No acute bony pathology Electronically Signed   By: Jolaine ClickArthur  Hoss M.D.   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 Andrey CampanileWilson, MD, FACS General, Bariatric, & Minimally Invasive Surgery [facility] [place]   LOS: 1 day    Gaynelle Aduric Denisia Harpole 09/02/2017

## 2017-09-03 LAB — CBC
HEMATOCRIT: 36.7 % — AB (ref 39.0–52.0)
HEMOGLOBIN: 11.4 g/dL — AB (ref 13.0–17.0)
MCH: 25 pg — AB (ref 26.0–34.0)
MCHC: 31.1 g/dL (ref 30.0–36.0)
MCV: 80.5 fL (ref 78.0–100.0)
Platelets: 211 10*3/uL (ref 150–400)
RBC: 4.56 MIL/uL (ref 4.22–5.81)
RDW: 15.2 % (ref 11.5–15.5)
WBC: 10.2 10*3/uL (ref 4.0–10.5)

## 2017-09-03 MED ORDER — OXYCODONE HCL 5 MG PO TABS: 5.0000 mg | ORAL_TABLET | Freq: Four times a day (QID) | ORAL | 0 refills | Status: DC | PRN

## 2017-09-03 MED ORDER — METHOCARBAMOL 750 MG PO TABS: 750.0000 mg | ORAL_TABLET | Freq: Three times a day (TID) | ORAL | 0 refills | Status: DC | PRN

## 2017-09-03 NOTE — Progress Notes (Signed)
[facility] Progress Note     Subjective: CC-  Patient states that he is feeling better today than yesterday. Pain improving. Taking small amount of oxycodone/robaxin, which helps. Pain is mostly from left rib fractures. Denies SOB. Pulling 2000 on IS. Tolerating diet. Denies n/b. Has gotten up to bathroom without issues. PT/OT pending.  Objective: Vital signs in last 24 hours: Temp:  [97.9 F (36.6 C)-99.5 F (37.5 C)] 99.5 F (37.5 C) (03/18 0411) Pulse Rate:  [66-71] 71 (03/18 0411) Resp:  [16] 16 (03/18 0411) BP: (113-131)/(55-72) 131/55 (03/18 0411) SpO2:  [97 %-100 %] 97 % (03/18 0411) Last BM Date: 08/31/17  Intake/Output from previous day: 03/17 0701 - 03/18 0700 In: 2152.5 [P.O.:1160; I.V.:992.5] Out: -  Intake/Output this shift: No intake/output data recorded.  PE: Gen:  Alert, NAD, pleasant HEENT: EOM's intact, pupils equal and round Card:  RRR, no M/G/R heard Pulm:  CTAB, no W/R/R, effort normal, pulling 2000 o nIS Abd: Soft, NT/ND, +BS, no HSM, no hernia Ext:  Sensory and motor function intact BUE/BLE. Calves soft and nontender Psych: A&Ox3  Skin: no rashes noted, warm and dry  Lab Results:  Recent Labs    09/02/17 0709 09/03/17 0453  WBC 9.8 10.2  HGB 11.7* 11.4*  HCT 37.2* 36.7*  PLT 221 211   BMET Recent Labs    09/01/17 1222 09/02/17 0709  NA 139 139  K 4.0 3.7  CL 106 107  CO2 22 25  GLUCOSE 151* 107*  BUN 14 12  CREATININE 1.06 0.77  CALCIUM 9.3 8.6*   PT/INR No results for input(s): LABPROT, INR in the last 72 hours. CMP     Component Value Date/Time   NA 139 09/02/2017 0709   K 3.7 09/02/2017 0709   CL 107 09/02/2017 0709   CO2 25 09/02/2017 0709   GLUCOSE 107 (H) 09/02/2017 0709   BUN 12 09/02/2017 0709   CREATININE 0.77 09/02/2017 0709   CREATININE 0.79 07/16/2015 1354   CALCIUM 8.6 (L) 09/02/2017 0709   PROT 8.1 07/16/2015 1354   ALBUMIN 4.2 07/16/2015 1354   AST 27 07/16/2015 1354   ALT 26  07/16/2015 1354   ALKPHOS 64 07/16/2015 1354   BILITOT 0.4 07/16/2015 1354   GFRNONAA >60 09/02/2017 0709   GFRAA >60 09/02/2017 0709   Lipase  No results found for: LIPASE     Studies/Results: Dg Chest 1 View  Result Date: 09/01/2017 CLINICAL DATA:  52 year old who fell approximately 10-12 feet off of his roof earlier today. Left-sided chest pain. Initial encounter. EXAM: CHEST  1 VIEW COMPARISON:  None. FINDINGS: Cardiac silhouette upper normal in size for AP technique. Thoracic aorta mildly tortuous. Hilar and mediastinal contours otherwise unremarkable. Minimal linear atelectasis or scar at the left lung base. Lungs otherwise clear. Pulmonary vascularity normal. No visible pleural effusions. No pneumothorax. Old healed left rib fractures.  No acute fractures are visible. IMPRESSION: Minimal linear atelectasis or scar at the left lung base. No acute cardiopulmonary disease otherwise. Electronically Signed   By: Hulan Saas M.D.   On: 09/01/2017 12:12   Dg Cervical Spine With Flex & Extend  Result Date: 09/02/2017 CLINICAL DATA:  Pain following fall EXAM: CERVICAL SPINE COMPLETE WITH FLEXION AND EXTENSION VIEWS COMPARISON:  Cervical spine CT September 01, 2017 FINDINGS: Frontal, neutral lateral, flexion lateral, extension lateral, open-mouth odontoid, and bilateral oblique views were obtained. There is no appreciable cervical spine fracture or spondylolisthesis. There is no appreciable change in lateral alignment with flexion-extension.  Prevertebral soft tissues and predental space regions are normal. The disc spaces appear normal. There is no appreciable exit foraminal narrowing on the oblique views. Fracture of the right first rib again noted. There is a small left apical pneumothorax, demonstrated on CT 1 day prior. IMPRESSION: No cervical spine fracture. Fracture first rib on the right. No spondylolisthesis. No change in lateral alignment with flexion-extension. No appreciable arthropathy.  Small left pneumothorax, stable. Electronically Signed   By: Bretta Bang III M.D.   On: 09/02/2017 21:41   Ct Head Wo Contrast  Result Date: 09/01/2017 CLINICAL DATA:  Patient status post fall from roof. Lower back pain. Headache. EXAM: CT HEAD WITHOUT CONTRAST CT CERVICAL SPINE WITHOUT CONTRAST TECHNIQUE: Multidetector CT imaging of the head and cervical spine was performed following the standard protocol without intravenous contrast. Multiplanar CT image reconstructions of the cervical spine were also generated. COMPARISON:  None. FINDINGS: CT HEAD FINDINGS Brain: Ventricles and sulci are appropriate for patient's age. Within the left vertex there is suggestion of a small focus of subarachnoid hemorrhage (image 25; series 4). No evidence for acute cortically based infarct, mass lesion or mass-effect. Vascular: Unremarkable. Skull: Intact.  No displaced fracture. Sinuses/Orbits: Paranasal sinuses are well aerated. Mastoid air cells unremarkable. Orbits are unremarkable. Other: None. CT CERVICAL SPINE FINDINGS Alignment: Normal anatomic alignment. Skull base and vertebrae: Intact. Soft tissues and spinal canal: No prevertebral fluid or swelling. No visible canal hematoma. Disc levels: Relative preservation of the vertebral body and intervertebral disc space heights. No acute cervical spine fracture. Upper chest: There is a displaced fracture through the posterior right first rib. Left apical pneumothorax. Other: None. IMPRESSION: 1. Left apical pneumothorax. 2. Displaced right first rib fracture. 3. Suggestion of small focus of subarachnoid hemorrhage overlying the left vertex. 4. No acute cervical spine fracture. 5. Critical Value/emergent results were called by telephone at the time of interpretation on 09/01/2017 at 2:17 pm to Dr. Rolland Porter , who verbally acknowledged these results. Electronically Signed   By: Annia Belt M.D.   On: 09/01/2017 14:23   Ct Cervical Spine Wo Contrast  Result Date:  09/01/2017 CLINICAL DATA:  Patient status post fall from roof. Lower back pain. Headache. EXAM: CT HEAD WITHOUT CONTRAST CT CERVICAL SPINE WITHOUT CONTRAST TECHNIQUE: Multidetector CT imaging of the head and cervical spine was performed following the standard protocol without intravenous contrast. Multiplanar CT image reconstructions of the cervical spine were also generated. COMPARISON:  None. FINDINGS: CT HEAD FINDINGS Brain: Ventricles and sulci are appropriate for patient's age. Within the left vertex there is suggestion of a small focus of subarachnoid hemorrhage (image 25; series 4). No evidence for acute cortically based infarct, mass lesion or mass-effect. Vascular: Unremarkable. Skull: Intact.  No displaced fracture. Sinuses/Orbits: Paranasal sinuses are well aerated. Mastoid air cells unremarkable. Orbits are unremarkable. Other: None. CT CERVICAL SPINE FINDINGS Alignment: Normal anatomic alignment. Skull base and vertebrae: Intact. Soft tissues and spinal canal: No prevertebral fluid or swelling. No visible canal hematoma. Disc levels: Relative preservation of the vertebral body and intervertebral disc space heights. No acute cervical spine fracture. Upper chest: There is a displaced fracture through the posterior right first rib. Left apical pneumothorax. Other: None. IMPRESSION: 1. Left apical pneumothorax. 2. Displaced right first rib fracture. 3. Suggestion of small focus of subarachnoid hemorrhage overlying the left vertex. 4. No acute cervical spine fracture. 5. Critical Value/emergent results were called by telephone at the time of interpretation on 09/01/2017 at 2:17 pm to Dr. Loraine Leriche  JAMES , who verbally acknowledged these results. Electronically Signed   By: Annia Belt M.D.   On: 09/01/2017 14:23   Mr Lumbar Spine Wo Contrast  Result Date: 09/01/2017 CLINICAL DATA:  52 year old male status post 10-12 foot fall from roof. Lumbar back pain radiating to the left hip. Nausea. Left side thoracic and  lumbar transverse process fractures T11 through L5 on CT today. Possible ventral epidural hematoma from L4 to the sacrum. EXAM: MRI LUMBAR SPINE WITHOUT CONTRAST TECHNIQUE: Multiplanar, multisequence MR imaging of the lumbar spine was performed. No intravenous contrast was administered. COMPARISON:  CT Abdomen and Pelvis 1339 hours today. FINDINGS: Segmentation:  Normal on the CT comparison. Alignment: Stable, mild straightening of lumbar lordosis. Mild grade 1 anterolisthesis of L5 on S1. Vertebrae: The mildly displaced left transverse process fractures throughout the lumbar spine are poorly visible on MRI. There is associated left paraspinal muscle edema along the level of the injuries best seen on series 3, image 15. Normal vertebral body marrow signal from the inferior T11 through the L4 level. The visible sacrum and SI joints appear intact. There is patchy and serpiginous marrow signal heterogeneity in the L5 vertebral body to the right of midline. This is not correlated on the earlier CT. Conus medullaris and cauda equina: Conus extends to the L1 level. Normal visible lower thoracic spinal cord and conus. The cauda equina nerve roots also appear normal. The epidural spaces are normal throughout the lumbar spine. Paraspinal and other soft tissues: Trace fluid tracking along the left psoas musculature laterally (series 5, image 15) associated with posterior ileo psoas muscle edema. Mild left erector spinae muscle edema in keeping with the left transverse process fractures. Small left oblique abdominal muscle hematoma is partially visible on series 5, image 29. Negative visible abdominal viscera. The right paraspinal soft tissues appear normal. Disc levels: No lower thoracic or lumbar spinal stenosis. There is disc bulging at L3-L4 and L5-S1, with vacuum disc at the latter, but no discrete disc herniation. Chronic L5 pars fractures are associated with mild anterolisthesis of L5 on S1. There is mild to moderate  L4-L5 through L5-S1 facet hypertrophy. There is associated mild bilateral L4 and L5 neural foraminal stenosis which appears greater on the left. IMPRESSION: 1. The left side lumbar transverse process fractures are poorly visible on MRI, but the associated left paraspinal muscle injury with edema, trace fluid, and occasional small intramuscular hematoma is well demonstrated. 2. Negative for lumbar epidural hematoma. No lumbar disc herniation or spinal stenosis. 3. Heterogeneous signal within the L5 vertebral body probably represents benign vertebral hemangioma rather than a posttraumatic vertebral body contusion. There is no lumbar vertebral compression fracture. 4. 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 Mild degenerative L4 and L5 neural foraminal stenosis. Electronically Signed   By: Odessa Fleming M.D.   On: 09/01/2017 21:46   Ct Abdomen Pelvis W Contrast  Result Date: 09/01/2017 CLINICAL DATA:  Larey Seat off of roof.  LEFT-sided pain. EXAM: CT ABDOMEN AND PELVIS WITH CONTRAST TECHNIQUE: Multidetector CT imaging of the abdomen and pelvis was performed using the standard protocol following bolus administration of intravenous contrast. CONTRAST:  100 mL ISOVUE-300 IOPAMIDOL (ISOVUE-300) INJECTION 61% COMPARISON:  None pertinent. FINDINGS: Lower chest: BILATERAL pleural effusions. Bibasilar atelectasis. Incompletely visualized LEFT posterior seventh rib fracture, mildly displaced. LEFT T11 and T12 transverse process fractures. LEFT T12 nondisplaced rib fracture. No pneumothorax is visible. Hepatobiliary: No hepatic injury or perihepatic hematoma. Gallbladder is unremarkable Pancreas: Unremarkable. No pancreatic ductal dilatation or  surrounding inflammatory changes. Spleen: No splenic injury or perisplenic hematoma. Adrenals/Urinary Tract: No adrenal hemorrhage or renal injury identified. Bladder is unremarkable. Stomach/Bowel: Stomach is within normal limits. Small hiatal hernia. Appendix appears  normal. No evidence of bowel wall thickening, distention, or inflammatory changes. Vascular/Lymphatic: No significant vascular findings are present. No enlarged abdominal or pelvic lymph nodes. Reproductive: Prostate is unremarkable. Other: No abdominal wall hernia or abnormality. No abdominopelvic ascites. Musculoskeletal: Multiple transverse process fractures on the LEFT, L1 through L5. LEFT psoas hematoma. Superficial LEFT posterior flank hematoma. 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 In the lower lumbosacral canal, there is increased attenuation within the ventral epidural space, extending from L4 through S1, maximal at L5. This measures up to 6 mm in thickness. Concern for ventral epidural hematoma. IMPRESSION: No splenic or visceral abdominal injury. Multiple LEFT-sided thoracic and lumbar transverse process fractures, T11 through L5. Fractures of the LEFT T7 and T12 ribs. LEFT psoas and flank hematomas. There is hyperattenuation within the ventral epidural space from L4 through S1. Early/developing ventral epidural hematoma not excluded. If the patient is stable, MRI of the lumbar spine is recommended for further evaluation. 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 If there is chest wall tenderness above the midthoracic region, CT chest could be performed for further evaluation, to exclude upper rib injury and or pneumothorax. These results were called by telephone at the time of interpretation on 09/01/2017 at 2:24 pm to Dr. Rolland Porter , who verbally acknowledged these results. Electronically Signed   By: Elsie Stain M.D.   On: 09/01/2017 14:27   Dg Chest Port 1 View  Result Date: 09/02/2017 CLINICAL DATA:  Trauma.  Rib fractures and pneumothorax. EXAM: PORTABLE CHEST 1 VIEW COMPARISON:  Chest CT 09/01/2017 FINDINGS: Normal mediastinum and cardiac silhouette. Multiple posterior LEFT rib fractures noted. Small pneumothorax identified on comparison CT is not  appreciable. No RIGHT pneumothorax. No pulmonary contusion evident. IMPRESSION: 1. No pneumothorax appreciable. 2. Multiple posterior LEFT rib fractures. Electronically Signed   By: Genevive Bi M.D.   On: 09/02/2017 08:37   Ct Angio Chest Aorta W/cm &/or Wo/cm  Result Date: 09/01/2017 CLINICAL DATA:  Fall from roof. EXAM: CT ANGIOGRAPHY CHEST WITH CONTRAST TECHNIQUE: Multidetector CT imaging of the chest was performed using the standard protocol during bolus administration of intravenous contrast. Multiplanar CT image reconstructions and MIPs were obtained to evaluate the vascular anatomy. CONTRAST:  <See Chart> ISOVUE-370 IOPAMIDOL (ISOVUE-370) INJECTION 76% COMPARISON:  CT abdomen 09/01/2017 FINDINGS: Cardiovascular: No contour abnormality of the thoracic aorta to suggest dissection or transsection. Great vessels are normal. No mediastinal hematoma. No pericardial fluid. Heart is normal. Mediastinum/Nodes: No mediastinal hematoma. Esophagus normal. Central airways normal. Lungs/Pleura: Trace pneumothorax at the LEFT lung base associated with rib fractures. The volume is extremely small along the margin posterior margin the diaphragm on image 81/8. No pulmonary contusion. Mild pleural thickening and atelectasis at the lung bases. RIGHT upper lobe pulmonary nodule measures 5 mm on image 66/8. Upper Abdomen: No acute injury Musculoskeletal: Minimally displaced LEFT fifth, sixth, and seventh ribs posteriorly on images 45 through 55/8. Unusual horizontal fracture through the first rib on the RIGHT on image 9/8. Additional nondisplaced RIGHT rib fractures of the posterior 6 rib image 48/8. Transverse process fracture at T 11 on the LEFT image 85, series 8 Review of the MIP images confirms the above findings. IMPRESSION: 1. No evidence of aortic injury. 2. Multiple minimally displaced posterior LEFT rib fractures. Trace pneumothorax associated  with these LEFT basilar rib fractures. 3. Unusual horizontal fracture  through the RIGHT first rib. 4. Additional nondisplaced fracture of the posterior RIGHT sixth rib. 5. RIGHT upper lobe pulmonary nodule. No follow-up needed if patient is low-risk. Non-contrast chest CT can be considered in 12 months if patient is high-risk. This recommendation follows the consensus statement: Guidelines for Management of Incidental Pulmonary Nodules Detected on CT Images: From the Fleischner Society 2017; Radiology 2017; 284:228-243. Electronically Signed   By: Genevive BiStewart  Edmunds M.D.   On: 09/01/2017 16:03   Dg Hip Unilat With Pelvis 2-3 Views Left  Result Date: 09/01/2017 CLINICAL DATA:  Fall EXAM: DG HIP (WITH OR WITHOUT PELVIS) 2-3V LEFT COMPARISON:  None. FINDINGS: No acute fracture. No dislocation.  Unremarkable soft tissues. IMPRESSION: No acute bony pathology Electronically Signed   By: Jolaine ClickArthur  Hoss M.D.   On: 09/01/2017 12:10    Anti-infectives: Anti-infectives (From admission, onward)   None       Assessment/Plan Fall Displaced R 1st rib fx - pain control and pulmonary toilet Left5-7 rib fx with occult L apical ptx - CXR 2/17 stable. Pain control and pulmonary toilet Small SAH overlying L vertex - per NS, nonop Multiple L thoracic and lumbar TP fxs T11-L5 - per NS, nonop, mobilize Left psoas and flank hematoma - Hg stable Hyperattentuation in ventral epidural space from L4-S1 - MRI negative Neck pain - flex ex negative, no neck pain today, c-spine clear  ID - none FEN - regular diet VTE - SCDs Foley - none Follow up - trauma clinic, NS PRN  Plan - PT/OT evaluations pending. If patient does well with therapies he will likely be ready for discharge this afternoon.    LOS: 2 days    Franne FortsBrooke A Meuth , [facility] 09/03/2017, 7:46 AM Pager: [phone] Consults: [phone] Mon-Fri 7:00 am-4:30 pm Sat-Sun 7:00 am-11:30 am

## 2017-09-03 NOTE — Evaluation (Signed)
Occupational Therapy Evaluation Patient Details Name: Roberto Hunter MRN: [id number] DOB: [birth year] Today's Date: 09/03/2017    History of Present Illness Pt is a 52 y.o. male s/p fall from ladder. Fall resulting in L apical pneumothorax, displaced R 1st rib fracture, L 5-7 rib fractures, L psoas and flank hematoma, small SAH overlying L vertex, and multiple L thoracic and lumbar TP fractures T11-S1. CT revealed no cervical spine fractures and [place] collar discontinued. PMH significant for rib fracture, toe fracture, and nephrolithiasis.   Clinical Impression   PTA, pt was independent with ADL and functional mobility. He currently requires overall supervision for standing ADL, ADL transfers, and LB ADL tasks. He is limited by L rib, back, and L flank pain but very motivated to return to independence. Educated pt and wife concerning back precautions related to ADL and compensatory strategies to maximize safety during dressing tasks as well as safe bed mobility techniques. Pt and wife able to teach back all topics. They report plan to complete bed baths initially once discharged home due to high tub and verbally educated concerning use of 3-in-1 for shower seat as well as raised toilet seat. Pt and wife verbalize understanding. Notified RN of recommendation for 3-in-1. Pt would benefit from continued OT services while admitted to improve independence and safety with ADL and functional mobility. Recommend initial 24 hour assistance post-acute D/C; however, do not anticipate need for continued OT follow-up. Acute OT will sign off.    Follow Up Recommendations  No OT follow up;Supervision/Assistance - 24 hour(initial 24 hour assistance)    Equipment Recommendations  3 in 1 bedside commode    Recommendations for Other Services       Precautions / Restrictions Precautions Precautions: Fall;Back Precaution Booklet Issued: No Precaution Comments: Reviewed back precautions with pt and wife and  they verbalize understanding. Able to teach back.  Restrictions Weight Bearing Restrictions: No      Mobility Bed Mobility Overal bed mobility: Modified Independent             General bed mobility comments: Able to demonstrate log roll technique safely.   Transfers Overall transfer level: Needs assistance Equipment used: None Transfers: Sit to/from Stand Sit to Stand: Supervision         General transfer comment: Supervision for safety.     Balance Overall balance assessment: Mild deficits observed, not formally tested                                         ADL either performed or assessed with clinical judgement   ADL Overall ADL's : Needs assistance/impaired Eating/Feeding: Sitting;Modified independent   Grooming: Supervision/safety;Standing   Upper Body Bathing: Set up;Sitting   Lower Body Bathing: Supervison/ safety;Sit to/from stand   Upper Body Dressing : Set up;Sitting   Lower Body Dressing: Supervision/safety;Sit to/from stand   Toilet Transfer: Supervision/safety;Ambulation   Toileting- Clothing Manipulation and Hygiene: Supervision/safety;Sit to/from stand       Functional mobility during ADLs: Supervision/safety General ADL Comments: Educated pt and wife concerning compensatory ADL strategies to maximize safety and adherance to precautions during ADL participation. Pt and wife demonstrate understanding.       Vision   Vision Assessment?: No apparent visual deficits     Perception     Praxis      Pertinent Vitals/Pain Pain Assessment: Faces Faces Pain Scale: Hurts little more Pain Location: bilateral ribs,  L flank Pain Descriptors / Indicators: Aching;Sore;Grimacing;Guarding Pain Intervention(s): Limited activity within patient's tolerance;Monitored during session;Repositioned     Hand Dominance     Extremity/Trunk Assessment Upper Extremity Assessment Upper Extremity Assessment: Overall WFL for tasks assessed    Lower Extremity Assessment Lower Extremity Assessment: LLE deficits/detail LLE Deficits / Details: painful due to hematoma       Communication Communication Communication: No difficulties(Converses well in [place] with therapist)   Cognition Arousal/Alertness: Awake/alert Behavior During Therapy: WFL for tasks assessed/performed Overall Cognitive Status: Within Functional Limits for tasks assessed                                 General Comments: Wife reports concern over pt's ability to remember precautions. Discussed compensatory techniques including written cues on mirror in the bathroom. Pt did repeat himself a few times and does have a small SAH. However, functionally able to complete basic ADL in hospital setting and teach back education topics.    General Comments  Wife and friend from church present during assessment.     Exercises     Shoulder Instructions      Home Living Family/patient expects to be discharged to:: Private residence Living Arrangements: Spouse/significant other Available Help at Discharge: Family;Friend(s);Available 24 hours/day Type of Home: House Home Access: Stairs to enter [employer] of Steps: 2 Entrance Stairs-Rails: Right;Left;Can reach both Home Layout: One level     Bathroom Shower/Tub: Chief Strategy Officer: Standard     Home Equipment: Hand held shower head;Hospital bed(adjustable bed)          Prior Functioning/Environment Level of Independence: Independent        Comments: Works as a Education administrator, independent with all ADL and IADL.         OT Problem List: Decreased strength;Decreased range of motion;Decreased activity tolerance;Impaired balance (sitting and/or standing);Decreased safety awareness;Decreased knowledge of precautions;Decreased knowledge of use of DME or AE;Pain      OT Treatment/Interventions:      OT Goals(Current goals can be found in the care plan section) Acute  Rehab OT Goals Patient Stated Goal: feel better and have some hot food OT Goal Formulation: With patient/family  OT Frequency:     Barriers to D/C:            Co-evaluation              AM-PAC PT "6 Clicks" Daily Activity     Outcome Measure Help from another person eating meals?: None Help from another person taking care of personal grooming?: A Little Help from another person toileting, which includes using toliet, bedpan, or urinal?: A Little Help from another person bathing (including washing, rinsing, drying)?: A Little Help from another person to put on and taking off regular upper body clothing?: A Little Help from another person to put on and taking off regular lower body clothing?: A Little 6 Click Score: 19   End of Session Nurse Communication: Mobility status;Other (comment)(pt would like clearance to shower)  Activity Tolerance: Patient tolerated treatment well Patient left: in bed;with call bell/phone within reach;with family/visitor present  OT Visit Diagnosis: Other abnormalities of gait and mobility (R26.89);Pain Pain - Right/Left: Left(and R ribs) Pain - part of body: Hip(ribs )                Time: 6962-9528 OT Time Calculation (min): 19 min Charges:  OT General Charges $OT Visit: 1 Visit  OT Evaluation $OT Eval Moderate Complexity: 1 Mod G-Codes:     Doristine Sectionharity A Bryona Foxworthy, MS OTR/L  Pager: [phone]   Sondi Desch A Kariann Wecker 09/03/2017, 9:13 AM

## 2017-09-03 NOTE — Evaluation (Signed)
Physical Therapy Evaluation Patient Details Name: Roberto Hunter MRN: [id number] DOB: Nov 15, 1965 Today's Date: 09/03/2017   History of Present Illness  Pt is a 52 y.o. male s/p fall from ladder. Fall resulting in L apical pneumothorax, displaced R 1st rib fracture, L 5-7 rib fractures, L psoas and flank hematoma, small SAH overlying L vertex, and multiple L thoracic and lumbar TP fractures T11-S1. CT revealed no cervical spine fractures and [place] collar discontinued. PMH significant for rib fracture, toe fracture, and nephrolithiasis.  Clinical Impression   Patient evaluated by Physical Therapy with no further acute PT needs identified. All education has been completed and the patient has no further questions. Moving and walking very well; Encouraged continuing deep breathing and use of incentive spirometer;  See below for any follow-up Physical Therapy or equipment needs. PT is signing off. Thank you for this referral.     Follow Up Recommendations No PT follow up    Equipment Recommendations  None recommended by PT    Recommendations for Other Services       Precautions / Restrictions Precautions Precautions: Fall;Back Precaution Booklet Issued: No Precaution Comments: Reviewed back precautions with pt and wife and they verbalize understanding. Able to teach back.  Restrictions Weight Bearing Restrictions: No      Mobility  Bed Mobility Overal bed mobility: Modified Independent             General bed mobility comments: Able to demonstrate log roll technique safely.   Transfers Overall transfer level: Needs assistance Equipment used: None Transfers: Sit to/from Stand Sit to Stand: Independent         General transfer comment: No difficulty  Ambulation/Gait Ambulation/Gait assistance: Independent Ambulation Distance (Feet): 550 Feet(greater than) Assistive device: None Gait Pattern/deviations: WFL(Within Functional Limits)   Gait velocity  interpretation: Below normal speed for age/gender General Gait Details: WNL, including ability to vary speed; O2 sats remained greater than 92% with amb on Room Air  Stairs Stairs: Yes Stairs assistance: Modified independent (Device/Increase time) Stair Management: One rail Right;Alternating pattern;Forwards Number of Stairs: 12 General stair comments: No difficultiy  Wheelchair Mobility    Modified Rankin (Stroke Patients Only)       Balance Overall balance assessment: No apparent balance deficits (not formally assessed)                                           Pertinent Vitals/Pain Pain Assessment: 0-10 Pain Score: 4  Faces Pain Scale: Hurts little more Pain Location: bilateral ribs, L flank Pain Descriptors / Indicators: Aching;Sore;Grimacing;Guarding Pain Intervention(s): Monitored during session;Other (comment)(taught pt pillow-splinting)    Home Living Family/patient expects to be discharged to:: Private residence Living Arrangements: Spouse/significant other Available Help at Discharge: Family;Friend(s);Available 24 hours/day Type of Home: House Home Access: Stairs to enter Entrance Stairs-Rails: Right;Left;Can reach both Entrance Stairs-Number of Steps: 2 Home Layout: One level Home Equipment: Hand held shower head;Hospital bed(adjustable bed)      Prior Function Level of Independence: Independent         Comments: Works as a Education administratorpainter, independent with all ADL and IADL.      Hand Dominance        Extremity/Trunk Assessment   Upper Extremity Assessment Upper Extremity Assessment: Defer to OT evaluation    Lower Extremity Assessment Lower Extremity Assessment: Overall WFL for tasks assessed LLE Deficits / Details: painful due to hematoma  Communication   Communication: No difficulties(Converses well in [place] with therapist)  Cognition Arousal/Alertness: Awake/alert Behavior During Therapy: WFL for tasks  assessed/performed Overall Cognitive Status: Within Functional Limits for tasks assessed                                 General Comments: Wife reports concern over pt's ability to remember precautions. Discussed compensatory techniques including written cues on mirror in the bathroom. Pt did repeat himself a few times and does have a small SAH. However, functionally able to complete basic ADL in hospital setting and teach back education topics.       General Comments General comments (skin integrity, edema, etc.): Wife and friend from church present during assessment.     Exercises     Assessment/Plan    PT Assessment Patent does not need any further PT services  PT Problem List         PT Treatment Interventions      PT Goals (Current goals can be found in the Care Plan section)  Acute Rehab PT Goals Patient Stated Goal: feel better and have some hot food PT Goal Formulation: All assessment and education complete, DC therapy    Frequency     Barriers to discharge        Co-evaluation               AM-PAC PT "6 Clicks" Daily Activity  Outcome Measure Difficulty turning over in bed (including adjusting bedclothes, sheets and blankets)?: A Little Difficulty moving from lying on back to sitting on the side of the bed? : A Little Difficulty sitting down on and standing up from a chair with arms (e.g., wheelchair, bedside commode, etc,.)?: None Help needed moving to and from a bed to chair (including a wheelchair)?: None Help needed walking in hospital room?: None Help needed climbing 3-5 steps with a railing? : None 6 Click Score: 22    End of Session Equipment Utilized During Treatment: (Os sat check) Activity Tolerance: Patient tolerated treatment well Patient left: in bed;with call bell/phone within reach;with family/visitor present(sitting EOB) Nurse Communication: Mobility status PT Visit Diagnosis: Other abnormalities of gait and mobility  (R26.89);Pain Pain - Right/Left: Left(Both sides ribs) Pain - part of body: (flank)    Time: 1610-9604 PT Time Calculation (min) (ACUTE ONLY): 28 min   Charges:   PT Evaluation $PT Eval Low Complexity: 1 Low PT Treatments $Gait Training: 8-22 mins   PT G Codes:        Van Clines, PT  Acute Rehabilitation Services Pager [phone] Office [phone]   Roberto Hunter 09/03/2017, 11:12 AM

## 2017-09-03 NOTE — Care Management Note (Signed)
Case Management Note  Patient Details  Name: Roberto Hunter MRN: [id number] Date of Birth: 08-09-1965  Subjective/Objective:   Pt is a 52 y.o. male s/p fall from ladder, resulting in L apical pneumothorax, displaced R 1st rib fracture, L 5-7 rib fractures, L psoas and flank hematoma, small SAH overlying L vertex, and multiple L thoracic and lumbar TP fractures--T11-S1.   PTA, pt independent, lives at home with spouse.                Action/Plan: PT/OT recommending no OP follow up, 3 in 1 for home use.  [facility]  Wife able to provide care at home.  Plan dc later today.    Expected Discharge Date:  09/03/17               Expected Discharge Plan:  Home/Self Care  In-House Referral:  Clinical Social Work  Discharge planning Services  CM Consult  Post Acute Care Choice:    Choice offered to:     DME Arranged:  3-N-1 DME Agency:  [facility]  HH Arranged:    HH Agency:     Status of Service:  Completed, signed off  If discussed at [employer] Length of Stay Meetings, dates discussed:    Additional Comments:  Quintella BatonJulie W. Jaley Yan, RN, BSN  Trauma/Neuro ICU Case Manager [phone]

## 2017-09-03 NOTE — Discharge Instructions (Signed)
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
## 2017-10-22 ENCOUNTER — Emergency Department (HOSPITAL_COMMUNITY)
Admission: EM | Admit: 2017-10-22 | Discharge: 2017-10-22 | Disposition: A | Discharge status: Home / Self Care | Payer: Commercial Managed Care - PPO | Attending: Emergency Medicine | Admitting: Emergency Medicine

## 2017-10-22 ENCOUNTER — Other Ambulatory Visit: Payer: Self-pay

## 2017-10-22 ENCOUNTER — Encounter (HOSPITAL_COMMUNITY): Payer: Self-pay | Admitting: *Deleted

## 2017-10-22 DIAGNOSIS — M25512 Pain in left shoulder: Secondary | ICD-10-CM | POA: Insufficient documentation

## 2017-10-22 DIAGNOSIS — R079 Chest pain, unspecified: Secondary | ICD-10-CM | POA: Diagnosis present

## 2017-10-22 DIAGNOSIS — M545 Low back pain, unspecified: Secondary | ICD-10-CM

## 2017-10-22 DIAGNOSIS — G8929 Other chronic pain: Secondary | ICD-10-CM

## 2017-10-22 DIAGNOSIS — Z79899 Other long term (current) drug therapy: Secondary | ICD-10-CM | POA: Diagnosis not present
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
## 2017-10-22 NOTE — ED Provider Notes (Signed)
[facility] EMERGENCY DEPARTMENT Provider Note   CSN: [id number] Arrival date & time: 10/22/17  1549     History   Chief Complaint Chief Complaint  Patient presents with  . Chest Pain    HPI Roberto Hunter is a 52 y.o. male.  HPI Roberto Hunter is a 52 y.o. male presents to emergency department with complaint of persistent pain after his fall month and a half ago.  Patient fell off the ladder and sustained very small left apical pneumothorax which resolved on its own, small subarachnoid hemorrhage, fracture of the right first rib and left ribs 5 through 7, psoas and flank hematoma on the left.  He has been following up with trauma clinic.  He has seen him 3 times, last time he was seen was today.  He was cleared to go back to work.  His accident was on 09/01/2017, which was month and a half ago.  Patient states he went to work, and was unable to perform some of the duties.  He states he has to climb ladders, get down on his knees, bend over and pick up things.  Patient states that his job told him that he needs to come back to emergency department for further treatment of his pain.  He denies any new injuries.  No numbness or weakness in extremities.  No trouble ambulating.  No loss of bladder or bowel control.  Past Medical History:  Diagnosis Date  . Fracture, rib   . Fracture, toe   . Nephrolithiasis 2012    Patient Active Problem List   Diagnosis Date Noted  . Fall 09/01/2017    History reviewed. No pertinent surgical history.      Home Medications    Prior to Admission medications   Medication Sig Start Date End Date Taking? Authorizing Provider  acetaminophen (TYLENOL) 325 MG tablet Take 650 mg by mouth every 6 (six) hours as needed for mild pain.    [provider]  hydrocortisone (ANUSOL-HC) 25 MG suppository Place 1 suppository (25 mg total) rectally 2 (two) times daily as needed for hemorrhoids or itching. Patient not taking: Reported on  08/02/2015 07/16/15   Porfirio Oar, PA-C  methocarbamol (ROBAXIN) 750 MG tablet Take 1 tablet (750 mg total) by mouth every 8 (eight) hours as needed for muscle spasms. 09/03/17   Meuth, Brooke A, PA-C  Multiple Vitamin (MULTIVITAMIN) tablet Take 1 tablet by mouth daily.    [provider]  oxyCODONE (OXY IR/ROXICODONE) 5 MG immediate release tablet Take 1 tablet (5 mg total) by mouth every 6 (six) hours as needed (pain not controlled withtylenol). 09/03/17   Meuth, Lina Sar, PA-C    Family History Family History  Problem Relation Age of Onset  . Colon cancer Neg Hx     Social History Social History   Tobacco Use  . Smoking status: Never Smoker  . Smokeless tobacco: Never Used  Substance Use Topics  . Alcohol use: No    Alcohol/week: 0.0 oz  . Drug use: No     Allergies   Patient has no known allergies.   Review of Systems Review of Systems  Constitutional: Negative for chills and fever.  Respiratory: Negative for cough, chest tightness and shortness of breath.   Cardiovascular: Positive for chest pain. Negative for palpitations and leg swelling.  Gastrointestinal: Negative for abdominal distention, abdominal pain, diarrhea, nausea and vomiting.  Genitourinary: Negative for dysuria, frequency, hematuria and urgency.  Musculoskeletal: Positive for arthralgias, back  pain and myalgias. Negative for neck pain and neck stiffness.  Skin: Negative for rash.  Allergic/Immunologic: Negative for immunocompromised state.  Neurological: Negative for dizziness, weakness, light-headedness, numbness and headaches.     Physical Exam Updated Vital Signs BP 125/85 (BP Location: Right Arm)   Pulse 76   Temp 98.3 F (36.8 C) (Oral)   Resp 16   SpO2 99%   Physical Exam  Constitutional: He appears well-developed and well-nourished. No distress.  Eyes: Conjunctivae are normal.  Neck: Neck supple.  Cardiovascular: Normal rate.  Pulmonary/Chest: No respiratory distress.    Abdominal: He exhibits no distension.  Musculoskeletal:  Tender to palpation over left SI joint and left hip.  Tenderness diffusely over left shoulder and clavicle.  Limited range of motion of the shoulder.  Skin: Skin is warm and dry.  Nursing note and vitals reviewed.    ED Treatments / Results  Labs (all labs ordered are listed, but only abnormal results are displayed) Labs Reviewed - No data to display  EKG None  Radiology No results found.  Procedures Procedures (including critical care time)  Medications Ordered in ED Medications - No data to display   Initial Impression / Assessment and Plan / ED Course  I have reviewed the triage vital signs and the nursing notes.  Pertinent labs & imaging results that were available during my care of the patient were reviewed by me and considered in my medical decision making (see chart for details).     Patient with persistent pain to the left hip and left shoulder after being involved in an accident, falling off the ladder.  He continues to have headaches and pain.  He has been seen in the trauma clinic 3 times.  He was cleared to go back to work and he is unable to work because of the pain.  He came back here for further evaluation.  I did not complete examination because patient became a little agitated and wanting to leave.  We did discuss however that patient should follow-up with his family doctor or orthopedic specialist if he continues to have joint pain or back pain after the fall.  I agreed with them that may be an month and a half, it is difficult to be completely better and perform physical job and he may require longer time to improve.  He may also require further imaging and test and may be physical therapy to get better.  This however will need to be done on outpatient basis.  He has not had any new falls or new injuries.  He denies any weakness or numbness in extremities.  I advised him to follow-up closely and if he has  any new injuries or new symptoms to return back to emergency department.  I am unable to take him out of work at this time and I advised him to also give the trauma clinic a call and explained to him that although he got a note to go back to work with no restrictions he is unable to do so to see if they can give him some specific restrictions.  Patient was not happy with this visit and left angry.  When I asked him his expectations with his visit to ER today, his wife stated that he needs more xrays and work note.    Vitals:   10/22/17 1649  BP: 125/85  Pulse: 76  Resp: 16  Temp: 98.3 F (36.8 C)  TempSrc: Oral  SpO2: 99%  Final Clinical Impressions(s) / ED Diagnoses   Final diagnoses:  Chronic left-sided low back pain without sciatica  Chronic left shoulder pain    ED Discharge Orders    None       Jaynie Crumble, PA-C 10/22/17 1749    Eber Hong, MD 10/24/17 [phone]

## 2017-10-23 ENCOUNTER — Ambulatory Visit (INDEPENDENT_AMBULATORY_CARE_PROVIDER_SITE_OTHER): Payer: Commercial Managed Care - PPO | Admitting: Family Medicine

## 2017-10-23 ENCOUNTER — Ambulatory Visit (INDEPENDENT_AMBULATORY_CARE_PROVIDER_SITE_OTHER): Payer: Commercial Managed Care - PPO

## 2017-10-23 ENCOUNTER — Other Ambulatory Visit: Payer: Self-pay

## 2017-10-23 ENCOUNTER — Encounter: Payer: Self-pay | Admitting: Family Medicine

## 2017-10-23 VITALS — BP 120/70 | HR 75 | Temp 98.4°F | Ht 63.0 in | Wt 171.6 lb

## 2017-10-23 DIAGNOSIS — M792 Neuralgia and neuritis, unspecified: Secondary | ICD-10-CM | POA: Diagnosis not present

## 2017-10-23 DIAGNOSIS — G44309 Post-traumatic headache, unspecified, not intractable: Secondary | ICD-10-CM

## 2017-10-23 DIAGNOSIS — M545 Low back pain: Secondary | ICD-10-CM

## 2017-10-23 DIAGNOSIS — M25552 Pain in left hip: Secondary | ICD-10-CM | POA: Diagnosis not present

## 2017-10-23 DIAGNOSIS — R42 Dizziness and giddiness: Secondary | ICD-10-CM

## 2017-10-23 DIAGNOSIS — M25512 Pain in left shoulder: Secondary | ICD-10-CM

## 2017-10-23 DIAGNOSIS — Z8781 Personal history of (healed) traumatic fracture: Secondary | ICD-10-CM | POA: Diagnosis not present

## 2017-10-23 NOTE — Progress Notes (Signed)
Subjective:  By signing my name below, I, Roberto Hunter, attest that this documentation has been prepared under the direction and in the presence of Roberto Staggers, MD. Electronically Signed: Stann Hunter, Scribe. 10/23/2017 , 2:27 PM .  Patient was seen in Room 2 .   Patient ID: Roberto Hunter, male    DOB: [birth year], 52 y.o.   MRN: [id number] Chief Complaint  Patient presents with  . Fall    accident from a month ago. (fall from roof) (PHQ-2 Total Score 1)   HPI Roberto Hunter is a 52 y.o. male Here for follow up after a fall 1 month ago. He was seen initially on March 16th for multiple trauma. Admitted March 16th through March 18th by trauma surgery. He had a left apical pneumothorax, small SAH, multiple left thoracic and lumbar transverse process fractures T11 to L5, right first rib fracture, left ribs 5th through 7th fracture, and a left psoas and flank hematoma after fall from his roof on March 16th. The [facility] was pinpoint without further follow up needed. The transverse process fractures were stable and was able to move as tolerated, received PT and OT treatment in the hospital. Discharged home on robaxin, oxycodone, and follow up on trauma clinic on April 2nd.   He was seen in the ER yesterday with persistent pain. Per ER note, he has seen trauma 3 times. Cleared to go back to work apparently yesterday. He was unable to perform some of the duties when he returned to work, as it requires to climbing ladders, bending and lifting. Tender left SI joint and left hip, as well as left shoulder pain, and persistent headaches. Per ER note, examination was not completed as patient became agitated and wanted to leave. Per ER note, there hasn't been any new injuries or new falls. In regards to his low back pain and left hip, I do see an xray of left hip on March 16th that was without acute bony pathology, and lumbar spine MRI, with small muscular intramuscular hematoma near the transverse process  fractures, no lumbar epidural hematoma, no lumbar disc herniation, or spinal stenosis.   Patient was initially seen at [facility], and they returned him to work without restrictions. However, when he took his paperwork to work yesterday, they wouldn't accept the paperwork. Patient states he still has some left hip pain when he bends down, has left shoulder pain and left rib pain trying to lift his left arm, as well as dizziness and headaches. His work informed him that they're afraid that he'll get dizzy after climbing ladders. Patient notes his dizziness and headaches have improved slightly though, still having occasional headaches. He denies taking any pain medication. He also mentions having numbness into his left upper arm since his fall.   He understands and speaks some [place], but primary language is [place]. His wife will translate for him in the room. He declines [employer] video interpreter today.    Depression screen [facility] 2/9 10/23/2017 07/16/2015  Decreased Interest 0 0  Down, Depressed, Hopeless 1 0  PHQ - 2 Score 1 0     Patient Active Problem List   Diagnosis Date Noted  . Fall 09/01/2017   Past Medical History:  Diagnosis Date  . Fracture, rib   . Fracture, toe   . Nephrolithiasis 2012   No past surgical history on file. No Known Allergies Prior to Admission medications   Medication Sig Start Date End Date Taking? Authorizing Provider  Multiple  Vitamin (MULTIVITAMIN) tablet Take 1 tablet by mouth daily.   Yes [provider]   Social History   Socioeconomic History  . Marital status: Single    Spouse name: Not on file  . Number of children: 1  . Years of education: Not on file  . Highest education level: Not on file  Occupational History  . Occupation: International aid/development worker  Social Needs  . Financial resource strain: Not on file  . Food insecurity:    Worry: Not on file    Inability: Not on file  . Transportation needs:    Medical: Not on file      Non-medical: Not on file  Tobacco Use  . Smoking status: Never Smoker  . Smokeless tobacco: Never Used  Substance and Sexual Activity  . Alcohol use: No    Alcohol/week: 0.0 oz  . Drug use: No  . Sexual activity: Yes    Partners: Female    Birth control/protection: None  Lifestyle  . Physical activity:    Days per week: Not on file    Minutes per session: Not on file  . Stress: Not on file  Relationships  . Social connections:    Talks on phone: Not on file    Gets together: Not on file    Attends religious service: Not on file    Active member of club or organization: Not on file    Attends meetings of clubs or organizations: Not on file    Relationship status: Not on file  . Intimate partner violence:    Fear of current or ex partner: Not on file    Emotionally abused: Not on file    Physically abused: Not on file    Forced sexual activity: Not on file  Other Topics Concern  . Not on file  Social History Narrative   Patient from [place]. Wife from [place]    Review of Systems  Constitutional: Negative for chills, fatigue, fever and unexpected weight change.  Eyes: Negative for visual disturbance.  Respiratory: Negative for cough, chest tightness and shortness of breath.   Cardiovascular: Positive for chest pain (left rib pain). Negative for palpitations and leg swelling.  Gastrointestinal: Negative for abdominal pain and blood in stool.  Musculoskeletal: Positive for arthralgias, back pain and myalgias. Negative for gait problem and joint swelling.  Skin: Negative for rash and wound.  Neurological: Positive for dizziness, numbness and headaches. Negative for facial asymmetry, weakness and light-headedness.       Objective:   Physical Exam  Constitutional: He is oriented to person, place, and time. He appears well-developed and well-nourished. No distress.  HENT:  Head: Normocephalic and atraumatic.  Eyes: Pupils are equal, round, and reactive  to light. EOM are normal.  Neck: Neck supple.  Cardiovascular: Normal rate and regular rhythm.  Pulmonary/Chest: Effort normal and breath sounds normal. No respiratory distress.  Musculoskeletal: Normal range of motion.  Tenderness along left lateral rib margin Left shoulder: slight tenderness over the left AC, as well as into the upper trapezius, and left scapula Equal strength of upper extremities bilaterally, grip strength equal bilaterally C-spine non tender, skin intact, no ecchymosis Left hip: locates pain more into left lower back into left buttocks, no focal tenderness to palpation, but does have pain in left hip with rotation  Neurological: He is alert and oriented to person, place, and time.  Reflex Scores:      Tricep reflexes are 2+ on the left side.  Bicep reflexes are 2+ on the left side.      Brachioradialis reflexes are 2+ on the left side.      Patellar reflexes are 2+ on the left side.      Achilles reflexes are 2+ on the left side. Skin: Skin is warm and dry.  Psychiatric: He has a normal mood and affect. His behavior is normal.  Nursing note and vitals reviewed.   Vitals:   10/23/17 1347  BP: 120/70  Pulse: 75  Temp: 98.4 F (36.9 C)  TempSrc: Oral  SpO2: 96%  Weight: 171 lb 9.6 oz (77.8 kg)  Height:  (1.6 m)   Dg Cervical Spine Complete  Result Date: 10/23/2017 CLINICAL DATA:  The patient fell 3 years ago and had multiple injuries and has persistent left shoulder pain. EXAM: CERVICAL SPINE - COMPLETE 4+ VIEW COMPARISON:  Cervical spine series of September 02, 2017 FINDINGS: The cervical vertebral bodies are preserved in height. The disc space heights are well maintained. The prevertebral soft tissue spaces are normal. There is no perched facet or spinous process fracture. The oblique views reveal no bony encroachment upon the neural foramina. A right first rib fracture is visible. IMPRESSION: There is no acute bony abnormality of the cervical spine. There  is a right first rib fracture. No residual pneumothorax is observed. Electronically Signed   By: David  [place] M.D.   On: 10/23/2017 15:14   Dg Lumbar Spine 2-3 Views  Result Date: 10/23/2017 CLINICAL DATA:  Larey Seat in March of 2016 with multiple injuries. Persistent left hip pain. EXAM: LUMBAR SPINE - 2-3 VIEW COMPARISON:  Coronal and sagittal CT images through the lumbar spine from a study of September 01, 2017 FINDINGS: The lumbar vertebral bodies are preserved in height. There is mild disc space narrowing at L5-S1 which is stable. Old transverse process fractures are ununited from L1 through L4. The L5 left transverse process tip fracture is not clearly evident. The pedicles are normal in appearance. There is no spondylolisthesis. There are bilateral pars defects at L5. IMPRESSION: No compression fracture. Disc space narrowing at L5-S1, stable. 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 Electronically Signed   By: David  [place] M.D.   On: 10/23/2017 15:11   Dg Shoulder Left  Result Date: 10/23/2017 CLINICAL DATA:  Chronic left shoulder pain EXAM: LEFT SHOULDER - 2+ VIEW COMPARISON:  None. FINDINGS: There is no evidence of fracture or dislocation. There is no evidence of arthropathy or other focal bone abnormality. Soft tissues are unremarkable. IMPRESSION: Negative. Electronically Signed   By: Deatra Robinson M.D.   On: 10/23/2017 15:05   Dg Hip Unilat W Or W/o Pelvis 2-3 Views Left  Result Date: 10/23/2017 CLINICAL DATA:  Larey Seat 3 years ago and sustained multiple injuries and has had persistent left hip pain. EXAM: DG HIP (WITH OR WITHOUT PELVIS) 2-3V LEFT COMPARISON:  Left hip series of September 01, 2017 FINDINGS: The bony pelvis is subjectively adequately mineralized. There is no lytic or blastic lesion or evidence of an acute or old fracture. There is lucency through the tip of the left L4 transverse process consistent with a known old  fracture here. AP and lateral views of the left hip reveal preservation of the joint space. The articular surfaces of the femoral head and acetabulum remains smoothly rounded. The femoral neck, intertrochanteric, and subtrochanteric regions are normal. IMPRESSION: There is no acute or significant chronic bony abnormality of the left hip.  Electronically Signed   By: David  [place] M.D.   On: 10/23/2017 15:07       Assessment & Plan:    Roberto Hunter is a 52 y.o. male History of fracture due to fall - Plan: DG Lumbar Spine 2-3 Views, Ambulatory referral to Orthopedic Surgery Left low back pain, unspecified chronicity, with sciatica presence unspecified - Plan: DG Lumbar Spine 2-3 Views, Ambulatory referral to Orthopedic Surgery Left hip pain - Plan: DG HIP UNILAT W OR W/O PELVIS 2-3 VIEWS LEFT, Ambulatory referral to Orthopedic Surgery  - s/p fall, persistent pain. No acute concerns on in office imaging. Refer to ortho to decide on continued relative rest/PT, further eval if needed.   Post-traumatic headache, not intractable, unspecified chronicity pattern - Plan: Ambulatory referral to Neurology Episode of dizziness - Plan: Ambulatory referral to Neurology  - possible postconcussion HA. nonfocal exam. Refer to neuro, out of work note extended for now. Avoid driving if symptomatic and avoid working from elevated surfaces for now.   Left shoulder pain, unspecified chronicity - Plan: DG Shoulder Left, Ambulatory referral to Orthopedic Surgery Radicular pain in left arm - Plan: DG Cervical Spine Complete, Ambulatory referral to Orthopedic Surgery  - Reassuring shoulder and Cspine XR in office. Has some radicular symptoms but also discomfort at [facility] and trapezius. Due to persistent symptoms, refer to ortho for further eval and decision on advanced imaging.   -tylenol for pain, call If stronger med needed.   Spanish spoken and spouse translating - video interpreter offered, but declined. Understanding  expressed.    No orders of the defined types were placed in this encounter.  Patient Instructions   I will refer you to a neurologist for follow-up of dizziness and headaches.  Avoid work from elevated surfaces I would also recommend against driving if you are dizzy or having any acute headache..  Return to the clinic or go to the nearest emergency room if any of your symptoms worsen or new symptoms occur.  Tylenol if needed for aches and pains, let me know if a stronger medication is needed.  However I will refer you to an orthopedist to follow-up on the back pain, shoulder pain, and left arm symptoms.  Chest wall pain may take a while to improve as rib fractures can be sore for some time.  You can also discuss this with an orthopedist.  I do not see any new issues on your x-rays today.  I will provide a note out of work for the next 2 weeks until you can follow-up with an orthopedist to decide on return to work timing.  Thank you for coming in today.   Dolor en la pared torcica Chest Wall Pain El dolor en la pared torcica se produce en los huesos y los msculos del pecho o alrededor de Scientist, product/process development. A veces, una lesin Occupational psychologist. En ocasiones, la causa puede ser desconocida. Este dolor puede durar varias semanas. Siga estas instrucciones en su casa: Est atento a cualquier cambio en los sntomas. Tome estas medidas para Acupuncturist dolor:  Haga reposo como se lo haya indicado el mdico.  Evite las actividades que causan dolor. Estas pueden ser [employer] requieren el uso de los msculos del trax, los abdominales o los laterales para levantar objetos pesados.  Si se lo indican, aplique hielo sobre la zona dolorida: ? Nature conservation officer hielo en una bolsa plstica. ? Coloque una [employer] piel y la bolsa de hielo. ? Coloque el hielo  durante , 2 a 3veces por da.  Tome los medicamentos de venta libre y los recetados solamente como se lo haya indicado el mdico.  No consuma  productos que contengan tabaco, incluidos cigarrillos, tabaco de Theatre manager y Administrator, Civil Service. Si necesita ayuda para dejar de fumar, consulte al mdico.  Concurra a todas las visitas de control como se lo haya indicado el mdico. Esto es importante.  Comunquese con un mdico si:  Tiene fiebre.  El dolor de [place].  Aparecen nuevos sntomas. Solicite ayuda de inmediato si:  Tiene nuseas o vmitos.  Berenice Primas o tiene sensacin de desvanecimiento.  Tiene tos con flema (esputo) o expectora sangre al toser.  Le falta el aire. Esta informacin no tiene Theme park manager el consejo del mdico. Asegrese de hacerle al mdico cualquier pregunta que tenga. Document Released: 07/17/2006 Document Revised: 09/04/2016 Document Reviewed: 08/31/2014 Elsevier Interactive Patient Education  2018 [employer].     IF you received an x-ray today, you will receive an invoice from [facility]. Please contact [facility] at [phone] with questions or concerns regarding your invoice.   IF you received labwork today, you will receive an invoice from [place]. Please contact [facility] at [phone] with questions or concerns regarding your invoice.   Our billing staff will not be able to assist you with questions regarding bills from these companies.  You will be contacted with the lab results as soon as they are available. The fastest way to get your results is to activate your My Chart account. Instructions are located on the last page of this paperwork. If you have not heard from [place] regarding the results in 2 weeks, please contact this office.       I personally performed the services described in this documentation, which was scribed in my presence. The recorded information has been reviewed and considered for accuracy and completeness, addended by me as needed, and agree with information above.  Signed,   Roberto Staggers, MD Primary Care at [facility].  10/26/17 10:02 PM

## 2017-10-23 NOTE — Patient Instructions (Addendum)
I will refer you to a neurologist for follow-up of dizziness and headaches.  Avoid work from elevated surfaces I would also recommend against driving if you are dizzy or having any acute headache..  Return to the clinic or go to the nearest emergency room if any of your symptoms worsen or new symptoms occur.  Tylenol if needed for aches and pains, let me know if a stronger medication is needed.  However I will refer you to an orthopedist to follow-up on the back pain, shoulder pain, and left arm symptoms.  Chest wall pain may take a while to improve as rib fractures can be sore for some time.  You can also discuss this with an orthopedist.  I do not see any new issues on your x-rays today.  I will provide a note out of work for the next 2 weeks until you can follow-up with an orthopedist to decide on return to work timing.  Thank you for coming in today.   Dolor en la pared torcica Chest Wall Pain El dolor en la pared torcica se produce en los huesos y los msculos del pecho o alrededor de Scientist, product/process development. A veces, una lesin Occupational psychologist. En ocasiones, la causa puede ser desconocida. Este dolor puede durar varias semanas. Siga estas instrucciones en su casa: Est atento a cualquier cambio en los sntomas. Tome estas medidas para Acupuncturist dolor:  Haga reposo como se lo haya indicado el mdico.  Evite las actividades que causan dolor. Estas pueden ser [employer] requieren el uso de los msculos del trax, los abdominales o los laterales para levantar objetos pesados.  Si se lo indican, aplique hielo sobre la zona dolorida: ? Nature conservation officer hielo en una bolsa plstica. ? Coloque una [employer] piel y la bolsa de hielo. ? Coloque el hielo durante , 2 a 3veces por da.  Tome los medicamentos de venta libre y los recetados solamente como se lo haya indicado el mdico.  No consuma productos que contengan tabaco, incluidos cigarrillos, tabaco de Theatre manager y Administrator, Civil Service. Si  necesita ayuda para dejar de fumar, consulte al mdico.  Concurra a todas las visitas de control como se lo haya indicado el mdico. Esto es importante.  Comunquese con un mdico si:  Tiene fiebre.  El dolor de [place].  Aparecen nuevos sntomas. Solicite ayuda de inmediato si:  Tiene nuseas o vmitos.  Berenice Primas o tiene sensacin de desvanecimiento.  Tiene tos con flema (esputo) o expectora sangre al toser.  Le falta el aire. Esta informacin no tiene Theme park manager el consejo del mdico. Asegrese de hacerle al mdico cualquier pregunta que tenga. Document Released: 07/17/2006 Document Revised: 09/04/2016 Document Reviewed: 08/31/2014 Elsevier Interactive Patient Education  2018 [employer].     IF you received an x-ray today, you will receive an invoice from [facility]. Please contact [facility] at [phone] with questions or concerns regarding your invoice.   IF you received labwork today, you will receive an invoice from [facility]. Please contact [facility] at [phone] with questions or concerns regarding your invoice.   Our billing staff will not be able to assist you with questions regarding bills from these companies.  You will be contacted with the lab results as soon as they are available. The fastest way to get your results is to activate your My Chart account. Instructions are located on the last page of this paperwork. If you have not heard from [place] regarding the results in 2 weeks,  please contact this office.

## 2017-10-29 ENCOUNTER — Ambulatory Visit (INDEPENDENT_AMBULATORY_CARE_PROVIDER_SITE_OTHER): Payer: Commercial Managed Care - PPO | Admitting: Orthopaedic Surgery

## 2017-10-29 ENCOUNTER — Encounter (INDEPENDENT_AMBULATORY_CARE_PROVIDER_SITE_OTHER): Payer: Self-pay | Admitting: Orthopaedic Surgery

## 2017-10-29 DIAGNOSIS — M545 Low back pain, unspecified: Secondary | ICD-10-CM | POA: Insufficient documentation

## 2017-10-29 DIAGNOSIS — M542 Cervicalgia: Secondary | ICD-10-CM

## 2017-10-29 DIAGNOSIS — M25512 Pain in left shoulder: Secondary | ICD-10-CM | POA: Diagnosis not present

## 2017-10-29 MED ORDER — PREDNISONE 10 MG (21) PO TBPK: ORAL_TABLET | ORAL | 0 refills | Status: DC

## 2017-10-29 MED ORDER — METHOCARBAMOL 500 MG PO TABS: 500.0000 mg | ORAL_TABLET | Freq: Three times a day (TID) | ORAL | 0 refills | Status: DC | PRN

## 2017-10-29 MED ORDER — VITAMIN D (ERGOCALCIFEROL) 1.25 MG (50000 UNIT) PO CAPS: 50000.0000 [IU] | ORAL_CAPSULE | ORAL | 0 refills | Status: DC

## 2017-10-29 NOTE — Progress Notes (Signed)
Office Visit Note   Patient: Roberto Hunter           Date of Birth: 08/12/1965           MRN: [id number] Visit Date: 10/29/2017              Requested by: Shade Flood, MD [address] [place], [place] [address] PCP: Patient, No Pcp Per   Assessment & Plan: Visit Diagnoses:  1. Neck pain   2. Acute pain of left shoulder   3. 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  At this point, we will provide the patient with a Sterapred taper, muscle relaxers as well as a prescription for vitamin D.  We will also provide him with a work note for desk work only over the next 4 weeks.  He will follow-up with [place] in 4 weeks time for recheck.  Call with concerns or questions in the meantime.  Total face to face encounter time was greater than 45 minutes and over half of this time was spent in counseling and/or coordination of care.  Follow-Up Instructions: Return in about 1 month (around 11/26/2017).   Orders:  No orders of the defined types were placed in this encounter.  No orders of the defined types were placed in this encounter.     Procedures: No procedures performed   Clinical Data: No additional findings.   Subjective: Chief Complaint  Patient presents with  . Left Hip - Pain  . Left Shoulder - Pain    HPI patient is a pleasant 52 year old gentleman who presents to our clinic today with neck, left shoulder and left hip pain.  This happened approximately 2 months ago when he fell off of a roof 14 feet in the air.  He was seen in the ED where multiple x-rays were obtained.  He has followed up with his primary care doctor who was ordered repeat x-rays of the cervical and lumbar spine as well as the left hip and left shoulder.  These were on 10/23/2017.  Cervical spine revealed a first rib fracture.  Shoulder x-rays were unremarkable.  Hip x-rays were unremarkable.  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  The pain he is having in his left shoulder radiates down the entire arm and into the left hand.  He does note numbness to all 5 fingers.  Pain is worse with extension of the cervical spine.  He also notes parascapular pain.  Left hip pain is primarily to the left paraspinous region at the lumbar spine.  Worse with lumbar flexion and extension.  No bowel or bladder change.  No saddle paresthesias.  Review of Systems as detailed in HPI.  All others reviewed and are negative.   Objective: Vital Signs: There were no vitals taken for this visit.  Physical Exam well-developed and well-nourished gentleman no acute distress.  Alert and oriented x3.  Ortho Exam examination of his left shoulder reveals full active range of motion although he does have some pain at the extremes of forward flexion.  He does have increased pain with cervical spine extension and flexion.  No spinous tenderness but he does have paraspinous tenderness on the left with soreness over the parascapular trigger points.   no point tenderness over the lumbar spine.  He does have  increased tenderness to the left-sided paraspinous region.  No tenderness to the greater trochanter.  Negative logroll.  Positive straight leg raise.  Full motor and sensory function distally. Specialty Comments:  No specialty comments available.  Imaging: No new imaging today   PMFS History: Patient Active Problem List   Diagnosis Date Noted  . Neck pain 10/29/2017  . Acute pain of left shoulder 10/29/2017  . Acute left-sided low back pain without sciatica 10/29/2017  . Fall 09/01/2017   Past Medical History:  Diagnosis Date  . Fracture, rib   . Fracture, toe   . Nephrolithiasis 2012    Family History  Problem Relation Age of Onset  . Colon cancer Neg Hx     History reviewed.  No pertinent surgical history. Social History   Occupational History  . Occupation: International aid/development worker  Tobacco Use  . Smoking status: Never Smoker  . Smokeless tobacco: Never Used  Substance and Sexual Activity  . Alcohol use: No    Alcohol/week: 0.0 oz  . Drug use: No  . Sexual activity: Yes    Partners: Female    Birth control/protection: None

## 2017-10-30 ENCOUNTER — Encounter: Payer: Self-pay | Admitting: Neurology

## 2017-10-30 ENCOUNTER — Ambulatory Visit (INDEPENDENT_AMBULATORY_CARE_PROVIDER_SITE_OTHER): Payer: Commercial Managed Care - PPO | Admitting: Neurology

## 2017-10-30 VITALS — BP 144/83 | HR 100 | Ht 63.0 in | Wt 172.5 lb

## 2017-10-30 DIAGNOSIS — S2249XD Multiple fractures of ribs, unspecified side, subsequent encounter for fracture with routine healing: Secondary | ICD-10-CM | POA: Diagnosis not present

## 2017-10-30 DIAGNOSIS — M545 Low back pain, unspecified: Secondary | ICD-10-CM | POA: Insufficient documentation

## 2017-10-30 DIAGNOSIS — G8929 Other chronic pain: Secondary | ICD-10-CM | POA: Diagnosis not present

## 2017-10-30 NOTE — Progress Notes (Signed)
PATIENT: Roberto Hunter DOB: September 26, 1965  Chief Complaint  Patient presents with  . New Patient (Initial Visit)    Ref by: Dr. Meredith Staggers. Patient here with friend, Lanny Cramp. Patient fell about 14 feet onto his left side.      HISTORICAL  Roberto Hunter is a 52 years old male, seen in refer by his primary care doctor Meredith Staggers for evaluation status post fall, initial evaluation was on Oct 30, 2017, is accompanied by his friend Lanny Cramp.  He fell off the ladder of 14 feet on September 01, 2017, he noticed a leak in his roof, water dripping in his house, he went on the room investigating it, fell off the roof, his friend arrived, noticed transient loss of consciousness, then recovered spontaneously, he was brought by his friend to emergency room for evaluation,   MRI of lumbar spine on September 01, 2017 showed mildly displaced left lumbar transverse process fracture throughout the lumbar spine, with associated left paraspinal muscle injury with edema, trace fluid, occasional small intramuscular hematoma, heterogeneous signal within the L5 vertebral body, probably represent benign vertebral hemangioma, vs posttraumatic vertebral body contusion, small left apical pneumothorax, which resolved on its own, subarachnoid hemorrhage, fracture of the right first rib, and the left 5-7th rib, flank hematoma on the left side,   He was able to go back to work after resting for few weeks, but was unable to perform his job duties, he works a Education administrator, he has to climb ladders, go down on his knee, bend over and pick up heavy objections, he was sent by his job to emergency room for further treatment of his pain on Oct 22, 2017, he complains of persistent pain to the left hip, left shoulder, continue to have headaches, Limitation for his job performance is mainly from the pain, he denies significant upper or lower extremity persistent sensory loss or weakness denies bowel and bladder   REVIEW OF SYSTEMS:  Full 14 system review of systems performed and notable only for dizziness, joint pain, achy muscles  ALLERGIES: No Known Allergies  HOME MEDICATIONS: Current Outpatient Medications  Medication Sig Dispense Refill  . predniSONE (STERAPRED UNI-PAK 21 TAB) 10 MG (21) TBPK tablet Take as directed 21 tablet 0  . Vitamin D, Ergocalciferol, (DRISDOL) 50000 units CAPS capsule Take 1 capsule (50,000 Units total) by mouth every 7 (seven) days for 6 doses. 6 capsule 0   No current facility-administered medications for this visit.     PAST MEDICAL HISTORY: Past Medical History:  Diagnosis Date  . Fracture, rib   . Fracture, toe   . Nephrolithiasis 2012    PAST SURGICAL HISTORY: History reviewed. No pertinent surgical history.  FAMILY HISTORY: Family History  Problem Relation Age of Onset  . Colon cancer Neg Hx     SOCIAL HISTORY:  Social History   Socioeconomic History  . Marital status: Married    Spouse name: Not on file  . Number of children: 1  . Years of education: Not on file  . Highest education level: Not on file  Occupational History  . Occupation: International aid/development worker  Social Needs  . Financial resource strain: Not on file  . Food insecurity:    Worry: Not on file    Inability: Not on file  . Transportation needs:    Medical: Not on file    Non-medical: Not on file  Tobacco Use  . Smoking status: Never Smoker  . Smokeless tobacco: Never Used  Substance and Sexual Activity  . Alcohol use: No    Alcohol/week: 0.0 oz  . Drug use: No  . Sexual activity: Yes    Partners: Female    Birth control/protection: None  Lifestyle  . Physical activity:    Days per week: Not on file    Minutes per session: Not on file  . Stress: Not on file  Relationships  . Social connections:    Talks on phone: Not on file    Gets together: Not on file    Attends religious service: Not on file    Active member of club or organization: Not on file    Attends meetings of clubs or  organizations: Not on file    Relationship status: Not on file  . Intimate partner violence:    Fear of current or ex partner: Not on file    Emotionally abused: Not on file    Physically abused: Not on file    Forced sexual activity: Not on file  Other Topics Concern  . Not on file  Social History Narrative   Patient from [place] in [place]. Wife from [place]      PHYSICAL EXAM   Vitals:   10/30/17 1133  BP: (!) 144/83  Pulse: 100  Weight: 172 lb 8 oz (78.2 kg)  Height:  (1.6 m)    Not recorded      Body mass index is 30.56 kg/m.  PHYSICAL EXAMNIATION:  Gen: NAD, conversant, well nourised, obese, well groomed                     Cardiovascular: Regular rate rhythm, no peripheral edema, warm, nontender. Eyes: Conjunctivae clear without exudates or hemorrhage Neck: Supple, no carotid bruits. Pulmonary: Clear to auscultation bilaterally   NEUROLOGICAL EXAM:  MENTAL STATUS: Speech:    Speech is normal; fluent and spontaneous with normal comprehension.  Cognition:     Orientation to time, place and person     Normal recent and remote memory     Normal Attention span and concentration     Normal Language, naming, repeating,spontaneous speech     Fund of knowledge   CRANIAL NERVES: CN II: Visual fields are full to confrontation. Fundoscopic exam is normal with sharp discs and no vascular changes. Pupils are round equal and briskly reactive to light. CN III, IV, VI: extraocular movement are normal. No ptosis. CN V: Facial sensation is intact to pinprick in all 3 divisions bilaterally. Corneal responses are intact.  CN VII: Face is symmetric with normal eye closure and smile. CN VIII: Hearing is normal to rubbing fingers CN IX, X: Palate elevates symmetrically. Phonation is normal. CN XI: Head turning and shoulder shrug are intact CN XII: Tongue is midline with normal movements and no atrophy.  MOTOR: There is no pronator drift of out-stretched arms.  Muscle bulk and tone are normal. Muscle strength is normal.  REFLEXES: Reflexes are 2+ and symmetric at the biceps, triceps, knees, and ankles. Plantar responses are flexor.  SENSORY: Intact to light touch, pinprick, positional sensation and vibratory sensation are intact in fingers and toes.  COORDINATION: Rapid alternating movements and fine finger movements are intact. There is no dysmetria on finger-to-nose and heel-knee-shin.    GAIT/STANCE: Posture is normal. Gait is steady with normal steps, base, arm swing, and turning. Heel and toe walking are normal. Tandem gait is normal.  Romberg is absent.   DIAGNOSTIC DATA (LABS, IMAGING, TESTING) - I reviewed patient  records, labs, notes, testing and imaging myself where available.   ASSESSMENT AND PLAN  Roberto Hunter is a 52 y.o. male   Left low back pain,  Status post a fall on August 31, 2017 with multiple left posterior rib fracture, left posterior lumbar transverse process fracture, left lumbar paraspinal muscle edema  No evidence of left lumbar radiculopathy  He will likely need much longer time to heal   Letter for job excuse.  Levert Feinstein, M.D. Ph.D.  [facility] [address], [address] [place], [place] [address] Ph: [phone] Fax: [phone]  CC: Shade Flood, MD

## 2017-11-27 ENCOUNTER — Encounter (INDEPENDENT_AMBULATORY_CARE_PROVIDER_SITE_OTHER): Payer: Self-pay | Admitting: Orthopaedic Surgery

## 2017-11-27 ENCOUNTER — Ambulatory Visit (INDEPENDENT_AMBULATORY_CARE_PROVIDER_SITE_OTHER): Payer: Commercial Managed Care - PPO | Admitting: Orthopaedic Surgery

## 2017-11-27 VITALS — Ht 63.0 in | Wt 172.0 lb

## 2017-11-27 DIAGNOSIS — M545 Low back pain, unspecified: Secondary | ICD-10-CM

## 2017-11-27 DIAGNOSIS — M25512 Pain in left shoulder: Secondary | ICD-10-CM

## 2017-11-27 DIAGNOSIS — M542 Cervicalgia: Secondary | ICD-10-CM

## 2017-11-27 NOTE — Progress Notes (Signed)
   Office Visit Note   Patient: Roberto Hunter           Date of Birth: 1966/01/29           MRN: [id number] Visit Date: 11/27/2017              Requested by: No referring provider defined for this encounter. PCP: Shade FloodGreene, Jeffrey R, MD   Assessment & Plan: Visit Diagnoses:  1. Neck pain   2. Acute pain of left shoulder   3. Acute left-sided low back pain without sciatica     Plan: Patient doing well from my standpoint at this point he can be released back to full duty later this week as planned.  Follow-up with me as needed.  Follow-Up Instructions: Return if symptoms worsen or fail to improve.   Orders:  No orders of the defined types were placed in this encounter.  No orders of the defined types were placed in this encounter.     Procedures: No procedures performed   Clinical Data: No additional findings.   Subjective: Chief Complaint  Patient presents with  . Neck - Follow-up  . Left Shoulder - Follow-up  . Lower Back - Follow-up    Roberto Hunter comes in today for his neck and shoulder and back pain.  He is doing much better.  He plans on returning back to work later this week.   Review of Systems   Objective: Vital Signs: Ht 5\' 3"  (1.6 m)   Wt 172 lb (78 kg)   BMI 30.47 kg/m   Physical Exam  Ortho Exam Exam is stable. Specialty Comments:  No specialty comments available.  Imaging: No results found.   PMFS History: Patient Active Problem List   Diagnosis Date Noted  . Closed fracture of multiple ribs with routine healing 10/30/2017  . Chronic left-sided low back pain without sciatica 10/30/2017  . Neck pain 10/29/2017  . Acute pain of left shoulder 10/29/2017  . Acute left-sided low back pain without sciatica 10/29/2017  . Fall 09/01/2017   Past Medical History:  Diagnosis Date  . Fracture, rib   . Fracture, toe   . Nephrolithiasis 2012    Family History  Problem Relation Age of Onset  . Colon cancer Neg Hx     No past surgical history  on file. Social History   Occupational History  . Occupation: International aid/development workerAuto body paint  Tobacco Use  . Smoking status: Never Smoker  . Smokeless tobacco: Never Used  Substance and Sexual Activity  . Alcohol use: No    Alcohol/week: 0.0 oz  . Drug use: No  . Sexual activity: Yes    Partners: Female    Birth control/protection: None

## 2017-11-29 ENCOUNTER — Telehealth: Payer: Self-pay | Admitting: Neurology

## 2017-11-29 NOTE — Telephone Encounter (Signed)
Pt's wife called said his job is requiring a letter stating it is ok for him to return to work. She said his job is requiring him to be seen by Dr Terrace ArabiaYan and then a letter of release. Please call to advise

## 2017-11-29 NOTE — Telephone Encounter (Signed)
Dr. Terrace ArabiaYan evaluated the patient once on 10/30/17.  She provided him with a letter to be excused from work until his re-evaluation on 11/30/17.  I spoke to his wife and he has an appt with Dr. Neva SeatGreene on 11/30/17 to follow up after his accident.  They are going to request Dr. Neva SeatGreene to provide his work release letter.

## 2017-11-30 ENCOUNTER — Encounter: Payer: Self-pay | Admitting: Family Medicine

## 2017-11-30 ENCOUNTER — Ambulatory Visit (INDEPENDENT_AMBULATORY_CARE_PROVIDER_SITE_OTHER): Payer: Commercial Managed Care - PPO | Admitting: Family Medicine

## 2017-11-30 VITALS — BP 131/83 | HR 79 | Temp 98.3°F | Resp 17 | Ht 63.5 in | Wt 174.0 lb

## 2017-11-30 DIAGNOSIS — Z8781 Personal history of (healed) traumatic fracture: Secondary | ICD-10-CM | POA: Diagnosis not present

## 2017-11-30 DIAGNOSIS — G44309 Post-traumatic headache, unspecified, not intractable: Secondary | ICD-10-CM | POA: Diagnosis not present

## 2017-11-30 DIAGNOSIS — M545 Low back pain, unspecified: Secondary | ICD-10-CM

## 2017-11-30 NOTE — Patient Instructions (Addendum)
As symptoms are improved, I think it is fine for you to return to work.  Okay to take Tylenol if needed for occasional aches or pains. If you are having increased pain that is affecting work, please return and we can adjust your restrictions.  As headaches and dizziness have resolved I do not think you need follow-up with neurology at this time.  Return to the clinic or go to the nearest emergency room if any of your symptoms worsen or new symptoms occur.    IF you received an x-ray today, you will receive an invoice from [facility]. Please contact [facility] at [phone] with questions or concerns regarding your invoice.   IF you received labwork today, you will receive an invoice from [facility]. Please contact [facility] at [phone] with questions or concerns regarding your invoice.   Our billing staff will not be able to assist you with questions regarding bills from these companies.  You will be contacted with the lab results as soon as they are available. The fastest way to get your results is to activate your My Chart account. Instructions are located on the last page of this paperwork. If you have not heard from us regarding the results in 2 weeks, please contact this office.

## 2017-11-30 NOTE — Progress Notes (Signed)
Subjective:  By signing my name below, I, Stann Ore, attest that this documentation has been prepared under the direction and in the presence of Meredith Staggers, MD. Electronically Signed: Stann Ore, Scribe. 11/30/2017 , 12:50 PM .  Patient was seen in Room 3 .   Patient ID: Roberto Hunter, male    DOB: September 16, 1965, 52 y.o.   MRN: [id number] Chief Complaint  Patient presents with  . Follow-up    fell off roof 3 months    HPI Roberto Hunter is a 52 y.o. male  Here for follow up. He was initially seen on May 7th after a fall from his roof on March 16th. He was admitted to trauma surgery from March 16th through 18th with multiple injuries detailed at last visit including left thoracic, lumbar transverse process fractures, left 5th-7th rib fractures, and right 1st rib fracture. He did have a small subarachnoid hemorrhage and pneumothorax as well. He had been discharged from trauma surgery clinic, but was still having significant discomfort in his left hip, SI joint, left shoulder and persistent headaches. He did have a left hip and lumbar spine MRI in March that was negative for bone pathology of the hip. He had a small hematoma near the transverse process fractures but no herniation and no lumbar epidural hematoma. He was having some dizziness with his headaches at last visit, although those were improving and was experiencing an occasional headache. For his arthralgia, he was referred, no concern in office for repeat left hip xray. He was also referred to neurology for post concussion headache. He was provided a work note at last visit, recommended avoidance of driving and elevated surfaces.   For his left shoulder pain and left arm pain, he had a reassuring left shoulder and C-spine xray in office. He was instructed to take tylenol for pain with ortho follow up planned. Appears he was seen by Dr. Roda Shutters on May 13th and June 11th. Per June 11th note from ortho that was reviewed, he was doing well  from his stand point with plan of release back to full duty this week, and follow up as needed. He also had neurology evaluation on May 14th with Dr. Terrace Arabia, with a letter at that time for work due to his left low back pain.    Today Patient states he's doing much better with overall improvement, back to about 90-95%. He was released by ortho to return to work today, but his work wanted to make sure he was able to return to work with the following conditions: able to wear a face mask for extended period of time, go underneath the buses, climb up on vehicles, as well as heavy lifting. 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 He states his breathing has improved. His last chest xray was on 09/02/17 without any more pneumothorax. He's still a little sore when he moves, but believes it's tight since he hasn't been moving too much although he's been doing some home exercises. He usually works 8 hours. He also reports headache, and dizziness have resolved.   He was seen with his wife present in the room today, providing additional information and translation as needed.   Patient Active Problem List   Diagnosis Date Noted  . Closed fracture of multiple ribs with routine healing 10/30/2017  . Chronic left-sided low back pain without sciatica 10/30/2017  .  Neck pain 10/29/2017  . Acute pain of left shoulder 10/29/2017  . Acute left-sided low back pain without sciatica 10/29/2017  . Fall 09/01/2017   Past Medical History:  Diagnosis Date  . Fracture, rib   . Fracture, toe   . Nephrolithiasis 2012   No past surgical history on file. No Known Allergies Prior to Admission medications   Medication Sig Start Date End Date Taking? Authorizing Provider  predniSONE (STERAPRED UNI-PAK 21 TAB) 10 MG (21) TBPK tablet Take as directed 10/29/17   Cristie Hem, PA-C  Vitamin D,  Ergocalciferol, (DRISDOL) 50000 units CAPS capsule Take 1 capsule (50,000 Units total) by mouth every 7 (seven) days for 6 doses. 10/29/17 12/04/17  Cristie Hem, PA-C   Social History   Socioeconomic History  . Marital status: Married    Spouse name: Not on file  . Number of children: 1  . Years of education: Not on file  . Highest education level: Not on file  Occupational History  . Occupation: International aid/development worker  Social Needs  . Financial resource strain: Not on file  . Food insecurity:    Worry: Not on file    Inability: Not on file  . Transportation needs:    Medical: Not on file    Non-medical: Not on file  Tobacco Use  . Smoking status: Never Smoker  . Smokeless tobacco: Never Used  Substance and Sexual Activity  . Alcohol use: No    Alcohol/week: 0.0 oz  . Drug use: No  . Sexual activity: Yes    Partners: Female    Birth control/protection: None  Lifestyle  . Physical activity:    Days per week: Not on file    Minutes per session: Not on file  . Stress: Not on file  Relationships  . Social connections:    Talks on phone: Not on file    Gets together: Not on file    Attends religious service: Not on file    Active member of club or organization: Not on file    Attends meetings of clubs or organizations: Not on file    Relationship status: Not on file  . Intimate partner violence:    Fear of current or ex partner: Not on file    Emotionally abused: Not on file    Physically abused: Not on file    Forced sexual activity: Not on file  Other Topics Concern  . Not on file  Social History Narrative   Patient from [place]. Wife from [place]     Review of Systems  Constitutional: Negative for fatigue and unexpected weight change.  Eyes: Negative for visual disturbance.  Respiratory: Negative for cough, chest tightness and shortness of breath.   Cardiovascular: Negative for chest pain, palpitations and leg swelling.  Gastrointestinal: Negative  for abdominal pain and blood in stool.  Musculoskeletal: Positive for myalgias. Negative for arthralgias and back pain.  Neurological: Negative for dizziness, weakness, light-headedness, numbness and headaches.       Objective:   Physical Exam  Constitutional: He is oriented to person, place, and time. He appears well-developed and well-nourished. No distress.  HENT:  Head: Normocephalic and atraumatic.  Eyes: Pupils are equal, round, and reactive to light. EOM are normal.  Neck: Neck supple.  Cardiovascular: Normal rate.  Pulmonary/Chest: Effort normal. No respiratory distress.  Lungs clear with breath sounds heard in all lung fields   Musculoskeletal: Normal range of motion.  Shoulders: full ROM of  bilateral shoulders with full rotator cuff strength, negative empty can, equal grip strength C-spine: pain free ROM Lumbar spine: flexion to 90 degrees, slight discomfort in muscles with rotation of lumbar spine locates to right paraspinals and left lower muscles  Neurological: He is alert and oriented to person, place, and time. He displays a negative Romberg sign.  normal heel-toe gait  Skin: Skin is warm and dry.  Psychiatric: He has a normal mood and affect. His behavior is normal.  Nursing note and vitals reviewed.   Vitals:   11/30/17 1204  BP: 131/83  Pulse: 79  Resp: 17  Temp: 98.3 F (36.8 C)  TempSrc: Oral  SpO2: 98%  Weight: 174 lb (78.9 kg)  Height: 5' 3.5" (1.613 m)       Assessment & Plan:  Roberto Hunter is a 52 y.o. male History of fracture due to fall  Low back pain without sciatica, unspecified back pain laterality, unspecified chronicity  Post-traumatic headache, not intractable, unspecified chronicity pattern  Headache is cleared, other areas of discomfort are significantly improved.  Evaluated and treated by orthopedics with return to work.  -   Will return to work full duty for now, but potential for episodic soreness still discussed.  If that is  limiting his work or significant worsening, could return to either restrictions or potentially shorter shift.  Did provide a letter with option of shortened shift if needed.  RTC precautions discussed  No orders of the defined types were placed in this encounter.  Patient Instructions   As symptoms are improved, I think it is fine for you to return to work.  Okay to take Tylenol if needed for occasional aches or pains. If you are having increased pain that is affecting work, please return and we can adjust your restrictions.  As headaches and dizziness have resolved I do not think you need follow-up with neurology at this time.  Return to the clinic or go to the nearest emergency room if any of your symptoms worsen or new symptoms occur.    IF you received an x-ray today, you will receive an invoice from [facility]. Please contact [facility] at [phone] with questions or concerns regarding your invoice.   IF you received labwork today, you will receive an invoice from [facility]. Please contact [facility] at [phone] with questions or concerns regarding your invoice.   Our billing staff will not be able to assist you with questions regarding bills from these companies.  You will be contacted with the lab results as soon as they are available. The fastest way to get your results is to activate your My Chart account. Instructions are located on the last page of this paperwork. If you have not heard from us regarding the results in 2 weeks, please contact this office.       I personally performed the services described in this documentation, which was scribed in my presence. The recorded information has been reviewed and considered for accuracy and completeness, addended by me as needed, and agree with information above.  Signed,   Meredith StaggersJeffrey Kassidee Narciso, MD [facility].  12/05/17 12:35 PM

## 2017-12-05 ENCOUNTER — Encounter: Payer: Self-pay | Admitting: Family Medicine

## 2019-09-16 IMAGING — DX DG CHEST 1V PORT
1 series · 1 of 1 positions shown · non-contrast
Comparison: Chest CT 09/01/2017

CLINICAL DATA: Trauma.  Rib fractures and pneumothorax.

EXAM:
PORTABLE CHEST 1 VIEW

[chest]
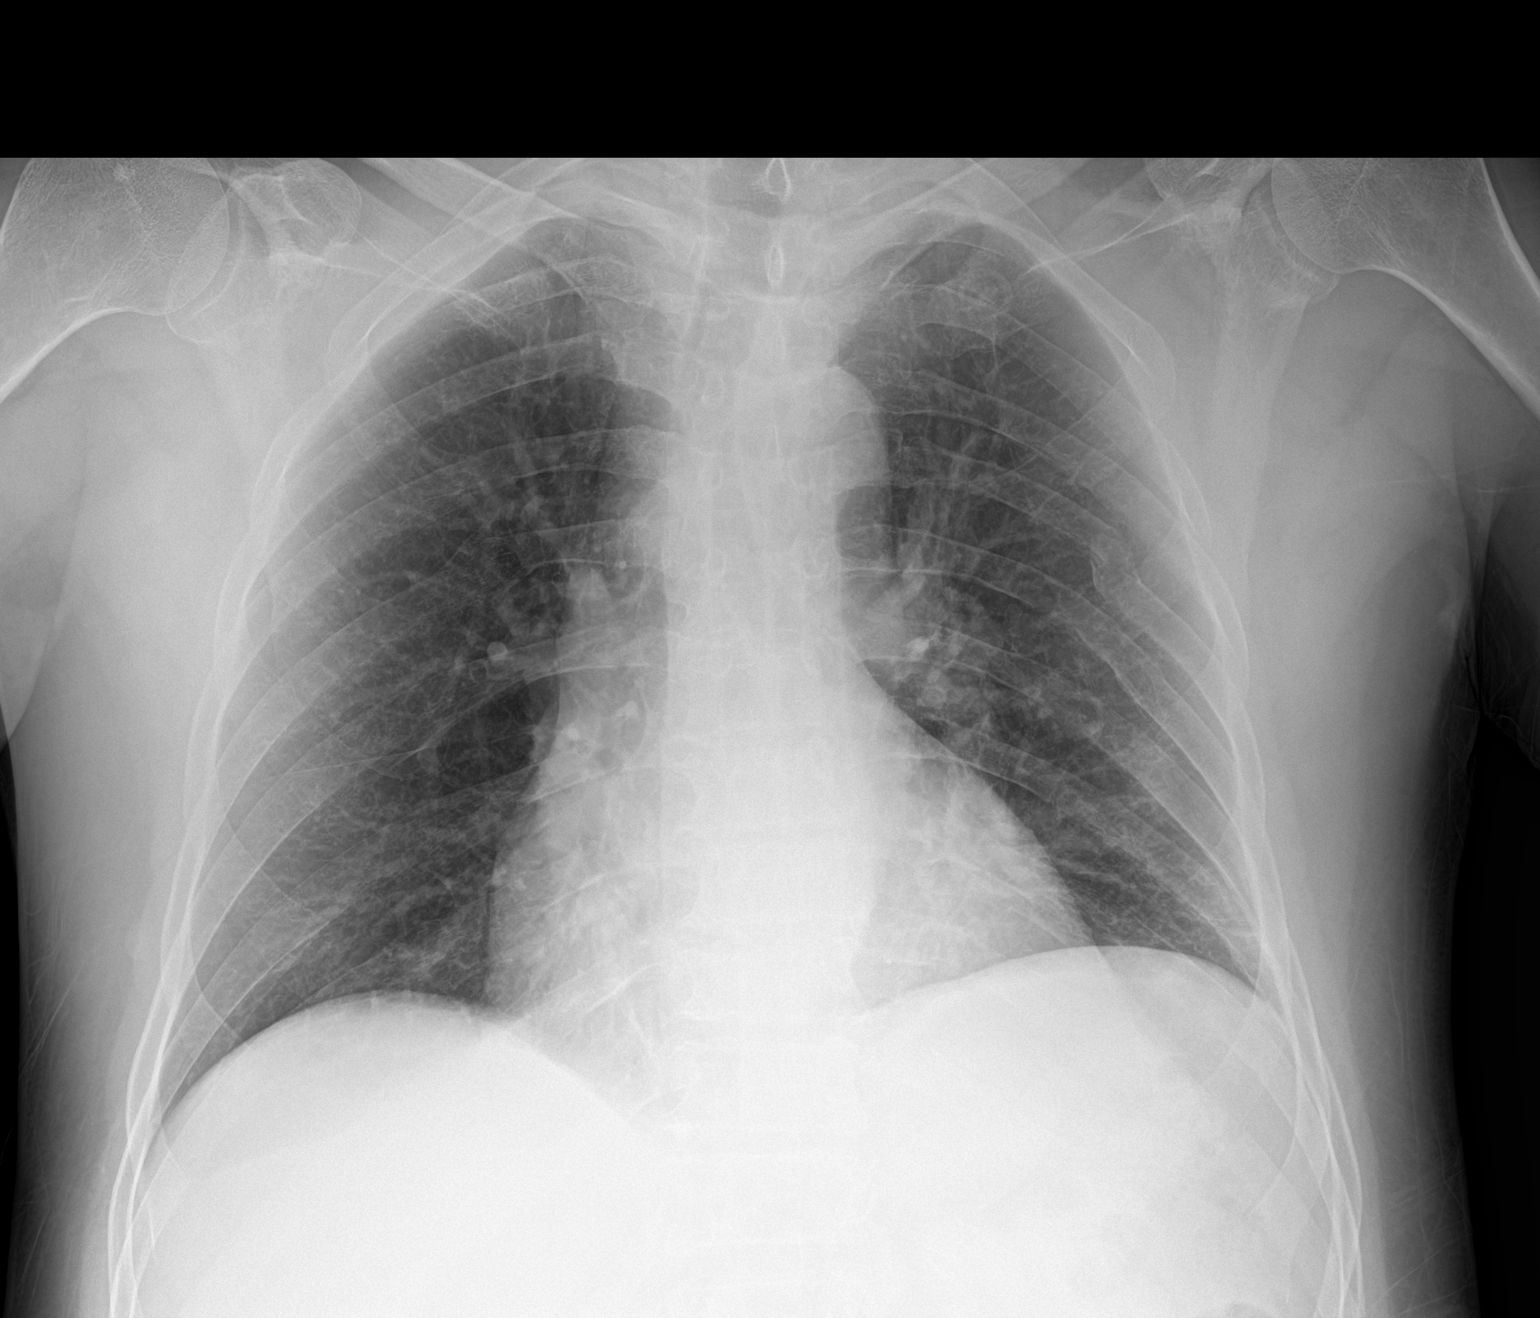

[1 of 1 positions shown; findings below may reference images not displayed]

FINDINGS: Normal mediastinum and cardiac silhouette. Multiple posterior LEFT
rib fractures noted. Small pneumothorax identified on comparison CT
is not appreciable. No RIGHT pneumothorax. No pulmonary contusion
evident.
IMPRESSION: 1. No pneumothorax appreciable.
2. Multiple posterior LEFT rib fractures.

## 2019-11-06 IMAGING — DX DG HIP (WITH OR WITHOUT PELVIS) 2-3V*L*
3 series · 3 of 3 positions shown · non-contrast
Comparison: Left hip series of September 01, 2017

CLINICAL DATA: Fell 3 years ago and sustained multiple injuries and
has had persistent left hip pain.

EXAM:
DG HIP (WITH OR WITHOUT PELVIS) 2-3V LEFT

[pelvis ap]
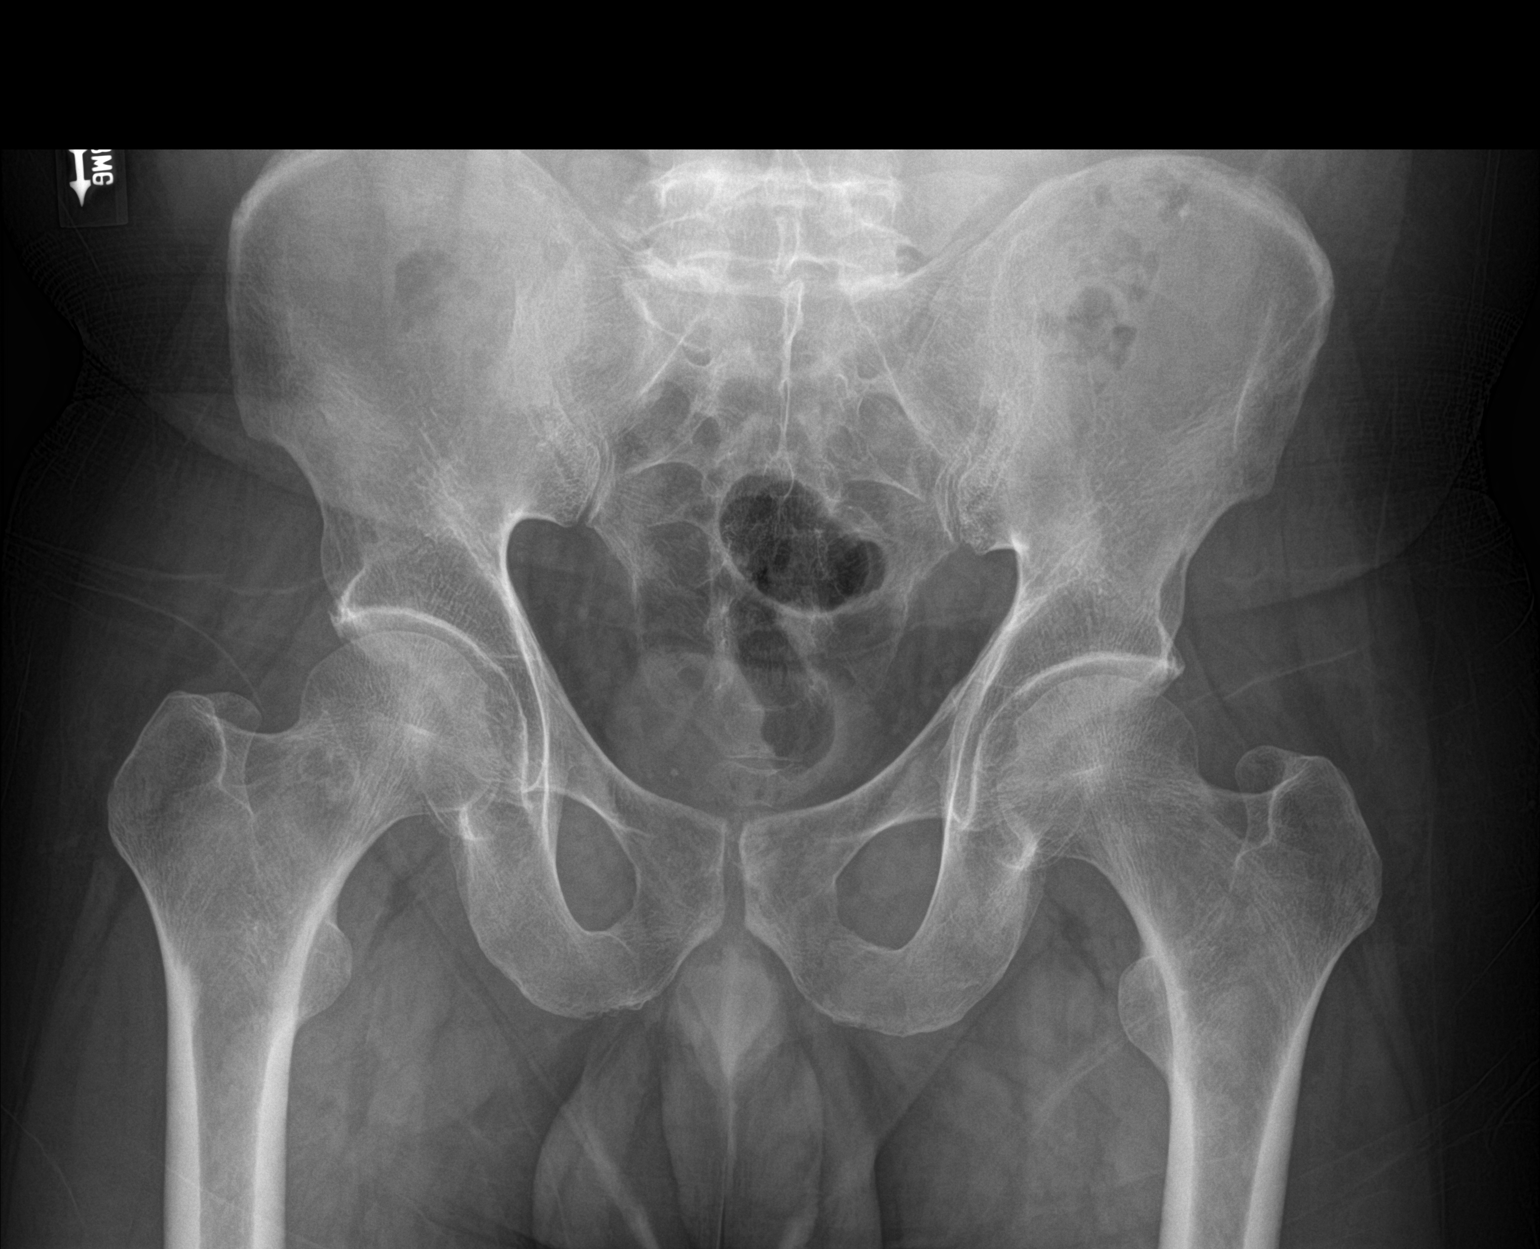

[hip ap]
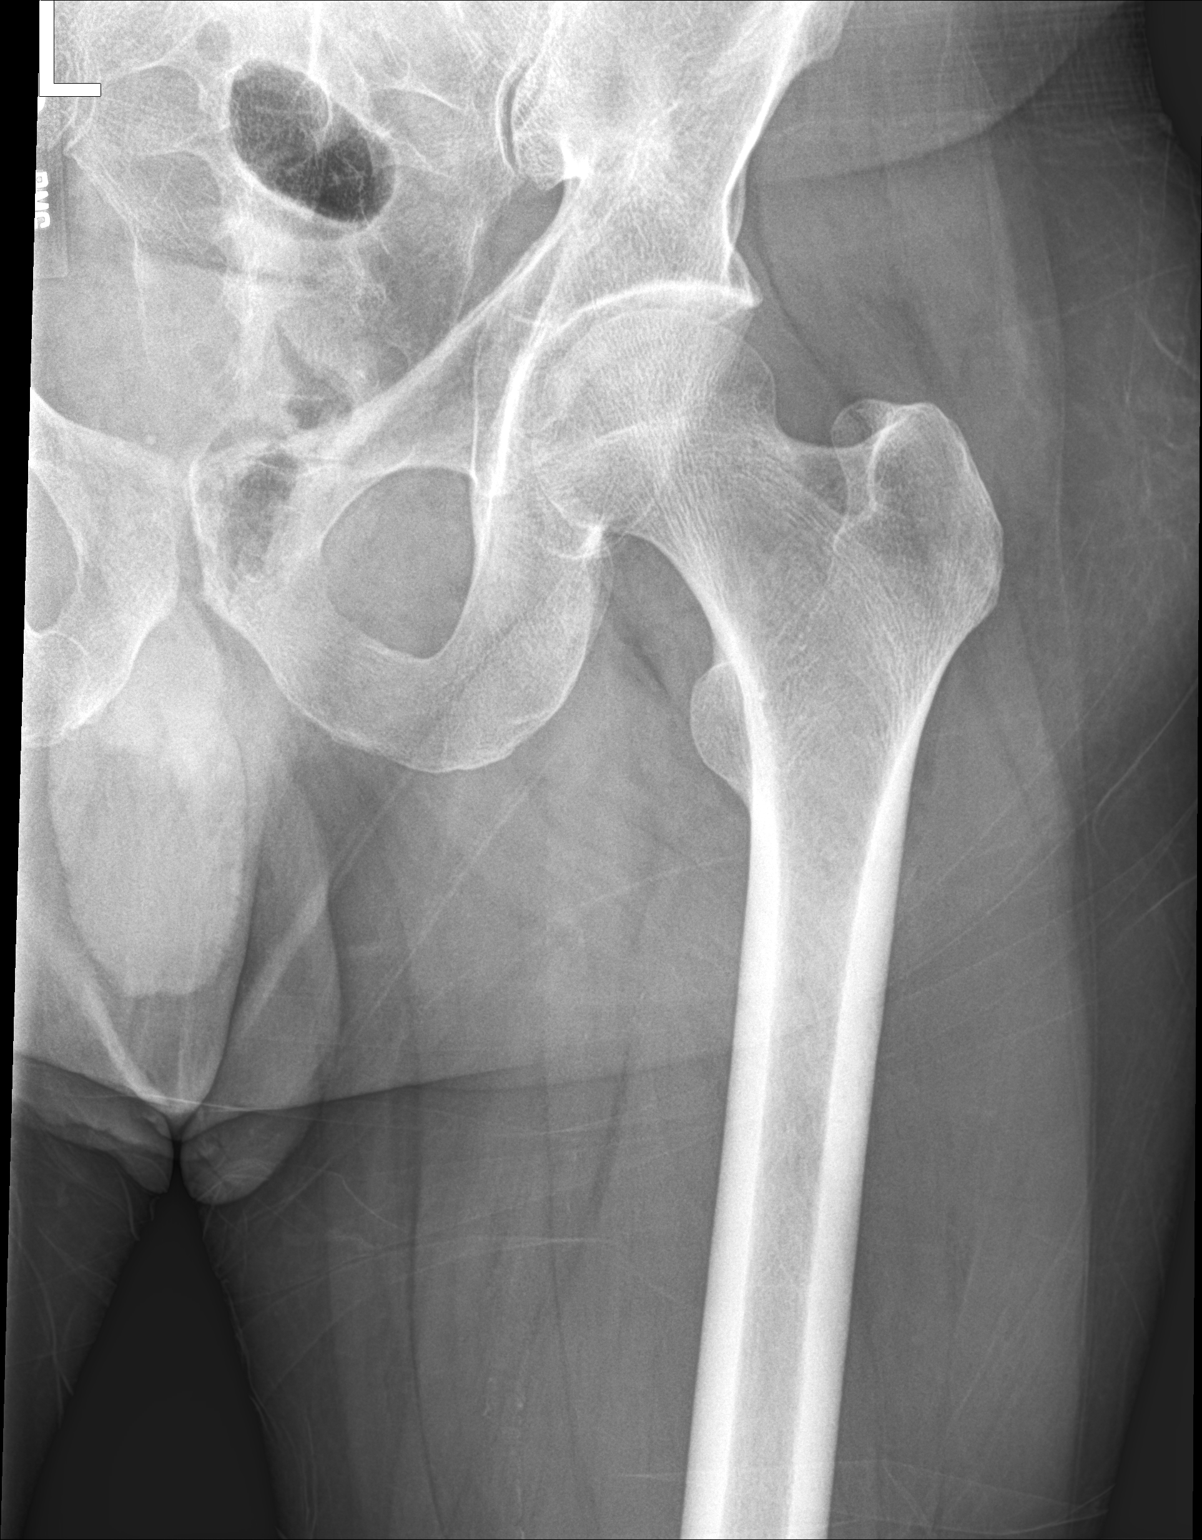

[hip lat]
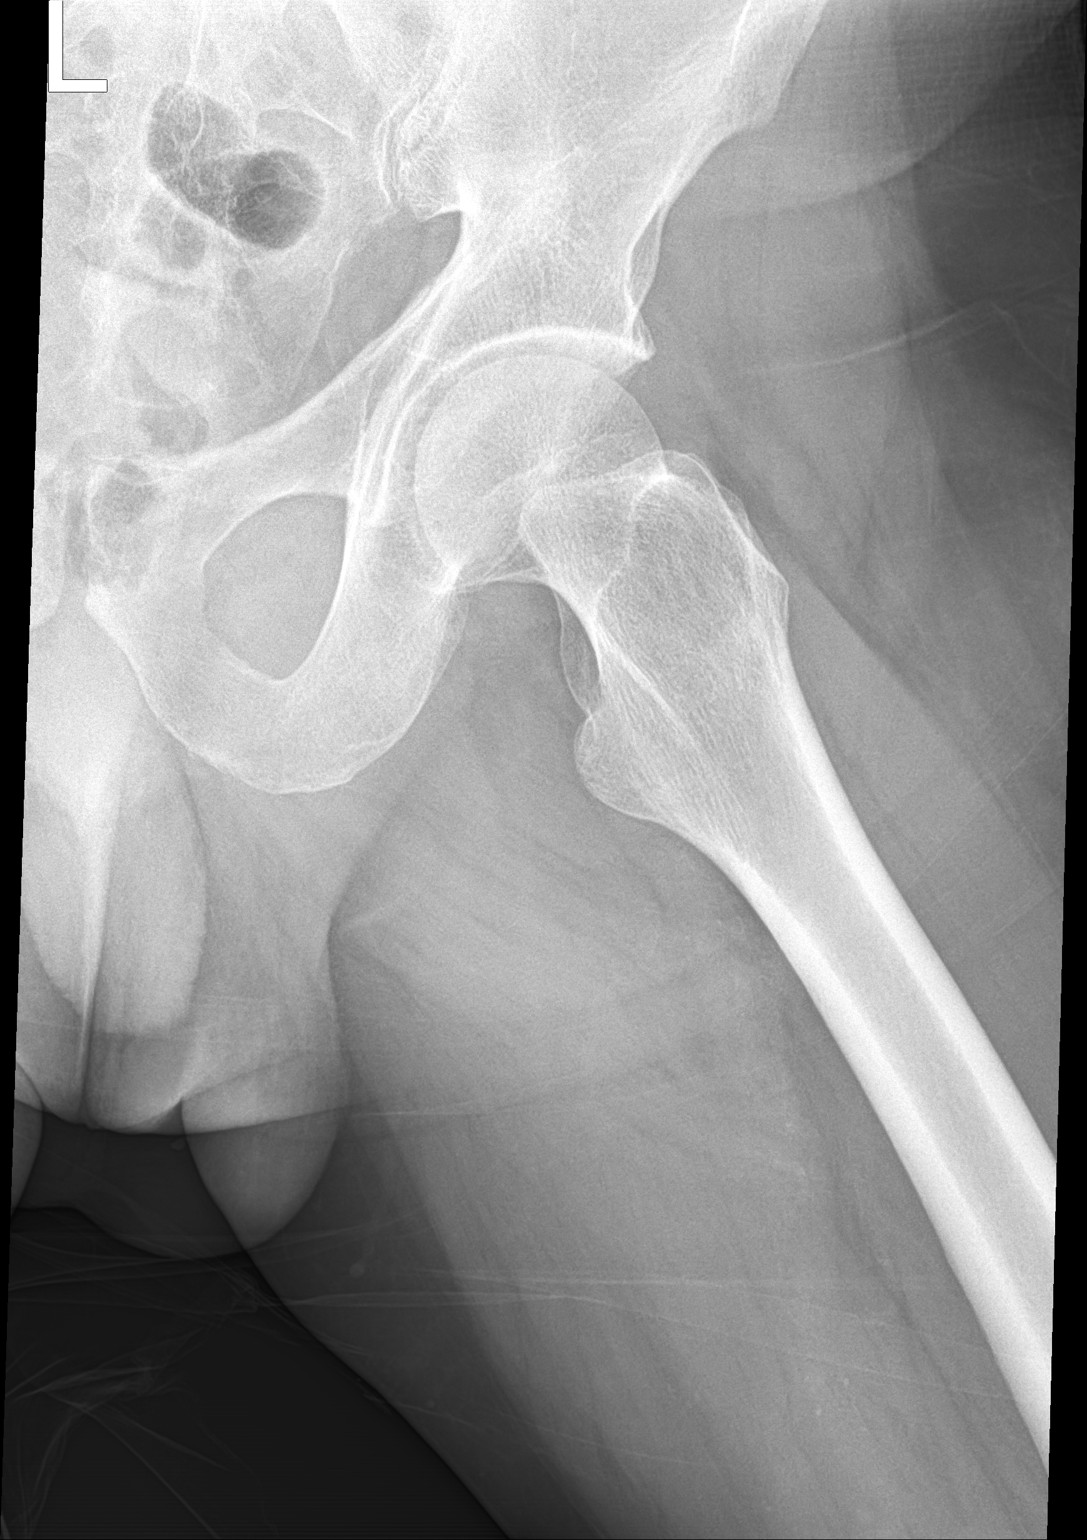

[3 of 3 positions shown; findings below may reference images not displayed]

FINDINGS: The bony pelvis is subjectively adequately mineralized. There is no
lytic or blastic lesion or evidence of an acute or old fracture.
There is lucency through the tip of the left L4 transverse process
consistent with a known old fracture here. AP and lateral views of
the left hip reveal preservation of the joint space. The articular
surfaces of the femoral head and acetabulum remains smoothly
rounded. The femoral neck, intertrochanteric, and subtrochanteric
regions are normal.
IMPRESSION: There is no acute or significant chronic bony abnormality of the
left hip.

## 2020-03-02 ENCOUNTER — Ambulatory Visit: Payer: Commercial Managed Care - PPO | Admitting: Emergency Medicine

## 2020-03-03 ENCOUNTER — Other Ambulatory Visit: Payer: Self-pay

## 2020-03-03 ENCOUNTER — Ambulatory Visit (INDEPENDENT_AMBULATORY_CARE_PROVIDER_SITE_OTHER): Payer: Commercial Managed Care - PPO | Admitting: Emergency Medicine

## 2020-03-03 ENCOUNTER — Encounter: Payer: Self-pay | Admitting: Emergency Medicine

## 2020-03-03 VITALS — BP 120/78 | HR 83 | Temp 98.7°F | Resp 16 | Ht 64.0 in | Wt 164.0 lb

## 2020-03-03 DIAGNOSIS — S40922A Unspecified superficial injury of left upper arm, initial encounter: Secondary | ICD-10-CM

## 2020-03-03 DIAGNOSIS — T07XXXA Unspecified multiple injuries, initial encounter: Secondary | ICD-10-CM

## 2020-03-03 DIAGNOSIS — Z23 Encounter for immunization: Secondary | ICD-10-CM

## 2020-03-03 DIAGNOSIS — W19XXXA Unspecified fall, initial encounter: Secondary | ICD-10-CM

## 2020-03-03 DIAGNOSIS — T148XXA Other injury of unspecified body region, initial encounter: Secondary | ICD-10-CM

## 2020-03-03 NOTE — Progress Notes (Signed)
Roberto Hunter 54 y.o.   Chief Complaint  Patient presents with  . Fall    injury to left side-shoulder to ribs it happened 02/23/2020    HISTORY OF PRESENT ILLNESS: This is a 54 y.o. male fell 11 days ago and hit left side of his body against medical sustaining multiple bruises.  Quickly improving.  Asymptomatic.  States his wife insisted he comes in to be seen and evaluated. No other complaints or medical concerns today.  HPI   Prior to Admission medications   Not on File    No Known Allergies  Patient Active Problem List   Diagnosis Date Noted  . Chronic left-sided low back pain without sciatica 10/30/2017    Past Medical History:  Diagnosis Date  . Fracture, rib   . Fracture, toe   . Nephrolithiasis 2012    History reviewed. No pertinent surgical history.  Social History   Socioeconomic History  . Marital status: Married    Spouse name: Not on file  . Number of children: 1  . Years of education: Not on file  . Highest education level: Not on file  Occupational History  . Occupation: International aid/development worker  Tobacco Use  . Smoking status: Never Smoker  . Smokeless tobacco: Never Used  Substance and Sexual Activity  . Alcohol use: No    Alcohol/week: 0.0 standard drinks  . Drug use: No  . Sexual activity: Yes    Partners: Female    Birth control/protection: None  Other Topics Concern  . Not on file  Social History Narrative   Patient from [place]. Wife from [place]    Social Determinants of Health   Financial Resource Strain:   . Difficulty of Paying Living Expenses: Not on file  Food Insecurity:   . Worried About Programme researcher, broadcasting/film/video in the Last Year: Not on file  . Ran Out of Food in the Last Year: Not on file  Transportation Needs:   . Lack of Transportation (Medical): Not on file  . Lack of Transportation (Non-Medical): Not on file  Physical Activity:   . Days of Exercise per Week: Not on file  . Minutes of Exercise per Session:  Not on file  Stress:   . Feeling of Stress : Not on file  Social Connections:   . Frequency of Communication with Friends and Family: Not on file  . Frequency of Social Gatherings with Friends and Family: Not on file  . Attends Religious Services: Not on file  . Active Member of Clubs or Organizations: Not on file  . Attends Banker Meetings: Not on file  . Marital Status: Not on file  Intimate Partner Violence:   . Fear of Current or Ex-Partner: Not on file  . Emotionally Abused: Not on file  . Physically Abused: Not on file  . Sexually Abused: Not on file    Family History  Problem Relation Age of Onset  . Colon cancer Neg Hx      Review of Systems  Constitutional: Negative.  Negative for chills and fever.  HENT: Negative.  Negative for congestion and sore throat.   Respiratory: Negative.  Negative for cough, hemoptysis and shortness of breath.   Cardiovascular: Negative.  Negative for chest pain and palpitations.  Gastrointestinal: Negative for abdominal pain, diarrhea, nausea and vomiting.  Genitourinary: Negative.  Negative for dysuria and hematuria.  Skin:       Multiple bruises  Neurological: Negative.  Negative for  dizziness and headaches.  All other systems reviewed and are negative.    Vitals:   03/03/20 1532  BP: 120/78  Pulse: 83  Resp: 16  Temp: 98.7 F (37.1 C)  SpO2: 96%     Physical Exam Vitals reviewed.  Constitutional:      Appearance: Normal appearance.  HENT:     Head: Normocephalic.     Comments: Positive bruising to left preauricular area    Left Ear: Tympanic membrane, ear canal and external ear normal.  Eyes:     Extraocular Movements: Extraocular movements intact.     Pupils: Pupils are equal, round, and reactive to light.  Cardiovascular:     Rate and Rhythm: Normal rate and regular rhythm.     Pulses: Normal pulses.     Heart sounds: Normal heart sounds.  Pulmonary:     Effort: Pulmonary effort is normal.      Breath sounds: Normal breath sounds.  Chest:     Chest wall: No tenderness.  Abdominal:     General: There is no distension.     Palpations: Abdomen is soft.     Tenderness: There is no abdominal tenderness.  Musculoskeletal:        General: Normal range of motion.     Cervical back: Normal range of motion and neck supple.  Skin:    Capillary Refill: Capillary refill takes less than 2 seconds.     Findings: Bruising present.     Comments: Bruising noted to left upper and lower chest areas, left upper arm, and left lower flank and abdomen areas.  Late stages of bruising  Neurological:     General: No focal deficit present.     Mental Status: He is alert and oriented to person, place, and time.  Psychiatric:        Mood and Affect: Mood normal.        Behavior: Behavior normal.      ASSESSMENT & PLAN: [name] was seen today for fall.  Diagnoses and all orders for this visit:  Traumatic ecchymosis of multiple sites  Accidental fall, initial encounter  Multiple bruises  Need for diphtheria-tetanus-pertussis (Tdap) vaccine -     Tdap vaccine greater than or equal to 7yo IM  Need for prophylactic vaccination and inoculation against influenza -     Flu Vaccine QUAD 36+ mos IM    Patient Instructions       If you have lab work done today you will be contacted with your lab results within the next 2 weeks.  If you have not heard from [place] then please contact us. The fastest way to get your results is to register for My Chart.   IF you received an x-ray today, you will receive an invoice from [facility]. Please contact [facility] at [phone] with questions or concerns regarding your invoice.   IF you received labwork today, you will receive an invoice from [facility]. Please contact [facility] at [phone] with questions or concerns regarding your invoice.   Our billing staff will not be able to assist you with questions regarding bills from these  companies.  You will be contacted with the lab results as soon as they are available. The fastest way to get your results is to activate your My Chart account. Instructions are located on the last page of this paperwork. If you have not heard from [place] regarding the results in 2 weeks, please contact this office.     Contusion A contusion is  a deep bruise. This is a result of an injury that causes bleeding under the skin. Symptoms of bruising include pain, swelling, and discolored skin. The skin may turn blue, purple, or yellow. Follow these instructions at home: Managing pain, stiffness, and swelling You may use RICE. This stands for:  Resting.  Icing.  Compression, or putting pressure.  Elevating, or raising the injured area. To follow this method, do these actions:  Rest the injured area.  If told, put ice on the injured area. ? Put ice in a plastic bag. ? Place a towel between your skin and the bag. ? Leave the ice on for 20 minutes, 2-3 times per day.  If told, put light pressure (compression) on the injured area using an elastic bandage. Make sure the bandage is not too tight. If the area tingles or becomes numb, remove it and put it back on as told by your doctor.  If possible, raise (elevate) the injured area above the level of your heart while you are sitting or lying down.  General instructions  Take over-the-counter and prescription medicines only as told by your doctor.  Keep all follow-up visits as told by your doctor. This is important. Contact a doctor if:  Your symptoms do not get better after several days of treatment.  Your symptoms get worse.  You have trouble moving the injured area. Get help right away if:  You have very bad pain.  You have a loss of feeling (numbness) in a hand or foot.  Your hand or foot turns pale or cold. Summary  A contusion is a deep bruise. This is a result of an injury that causes bleeding under the skin.  Symptoms of  bruising include pain, swelling, and discolored skin. The skin may turn blue, purple, or yellow.  This condition is treated with rest, ice, compression, and elevation. This is also called RICE. You may be given over-the-counter medicines for pain.  Contact a doctor if you do not feel better, or you feel worse. Get help right away if you have very bad pain, have lost feeling in a hand or foot, or the area turns pale or cold. This information is not intended to replace advice given to you by your health care provider. Make sure you discuss any questions you have with your health care provider. Document Revised: 01/25/2018 Document Reviewed: 01/25/2018 Elsevier Patient Education  2020 Elsevier Inc.      Edwina Barth, MD [facility]

## 2020-03-03 NOTE — Patient Instructions (Addendum)
   If you have lab work done today you will be contacted with your lab results within the next 2 weeks.  If you have not heard from us then please contact us. The fastest way to get your results is to register for My Chart.   IF you received an x-ray today, you will receive an invoice from [facility]. Please contact [facility] at [phone] with questions or concerns regarding your invoice.   IF you received labwork today, you will receive an invoice from [facility]. Please contact [facility] at [phone] with questions or concerns regarding your invoice.   Our billing staff will not be able to assist you with questions regarding bills from these companies.  You will be contacted with the lab results as soon as they are available. The fastest way to get your results is to activate your My Chart account. Instructions are located on the last page of this paperwork. If you have not heard from us regarding the results in 2 weeks, please contact this office.      Contusion A contusion is a deep bruise. This is a result of an injury that causes bleeding under the skin. Symptoms of bruising include pain, swelling, and discolored skin. The skin may turn blue, purple, or yellow. Follow these instructions at home: Managing pain, stiffness, and swelling You may use RICE. This stands for:  Resting.  Icing.  Compression, or putting pressure.  Elevating, or raising the injured area. To follow this method, do these actions:  Rest the injured area.  If told, put ice on the injured area. ? Put ice in a plastic bag. ? Place a towel between your skin and the bag. ? Leave the ice on for 20 minutes, 2-3 times per day.  If told, put light pressure (compression) on the injured area using an elastic bandage. Make sure the bandage is not too tight. If the area tingles or becomes numb, remove it and put it back on as told by your doctor.  If possible, raise (elevate) the injured  area above the level of your heart while you are sitting or lying down.  General instructions  Take over-the-counter and prescription medicines only as told by your doctor.  Keep all follow-up visits as told by your doctor. This is important. Contact a doctor if:  Your symptoms do not get better after several days of treatment.  Your symptoms get worse.  You have trouble moving the injured area. Get help right away if:  You have very bad pain.  You have a loss of feeling (numbness) in a hand or foot.  Your hand or foot turns pale or cold. Summary  A contusion is a deep bruise. This is a result of an injury that causes bleeding under the skin.  Symptoms of bruising include pain, swelling, and discolored skin. The skin may turn blue, purple, or yellow.  This condition is treated with rest, ice, compression, and elevation. This is also called RICE. You may be given over-the-counter medicines for pain.  Contact a doctor if you do not feel better, or you feel worse. Get help right away if you have very bad pain, have lost feeling in a hand or foot, or the area turns pale or cold. This information is not intended to replace advice given to you by your health care provider. Make sure you discuss any questions you have with your health care provider. Document Revised: 01/25/2018 Document Reviewed: 01/25/2018 Elsevier Patient Education  2020 Elsevier   Inc.  

## 2022-07-10 ENCOUNTER — Ambulatory Visit: Payer: BC Managed Care – PPO | Admitting: Emergency Medicine

## 2022-07-10 ENCOUNTER — Encounter: Payer: Self-pay | Admitting: Emergency Medicine

## 2022-07-10 ENCOUNTER — Other Ambulatory Visit: Payer: Self-pay | Admitting: Emergency Medicine

## 2022-07-10 VITALS — BP 132/80 | HR 74 | Temp 98.4°F | Ht 64.0 in | Wt 170.2 lb

## 2022-07-10 DIAGNOSIS — Z130 Encounter for screening for diseases of the blood and blood-forming organs and certain disorders involving the immune mechanism: Secondary | ICD-10-CM | POA: Diagnosis not present

## 2022-07-10 DIAGNOSIS — Z23 Encounter for immunization: Secondary | ICD-10-CM | POA: Diagnosis not present

## 2022-07-10 DIAGNOSIS — Z125 Encounter for screening for malignant neoplasm of prostate: Secondary | ICD-10-CM

## 2022-07-10 DIAGNOSIS — Z13228 Encounter for screening for other metabolic disorders: Secondary | ICD-10-CM

## 2022-07-10 DIAGNOSIS — Z0000 Encounter for general adult medical examination without abnormal findings: Secondary | ICD-10-CM | POA: Diagnosis not present

## 2022-07-10 DIAGNOSIS — R29818 Other symptoms and signs involving the nervous system: Secondary | ICD-10-CM | POA: Diagnosis not present

## 2022-07-10 DIAGNOSIS — Z1329 Encounter for screening for other suspected endocrine disorder: Secondary | ICD-10-CM | POA: Diagnosis not present

## 2022-07-10 DIAGNOSIS — Z13220 Encounter for screening for lipoid disorders: Secondary | ICD-10-CM

## 2022-07-10 LAB — CBC WITH DIFFERENTIAL/PLATELET
Basophils Absolute: 0.1 10*3/uL (ref 0.0–0.1)
Basophils Relative: 0.9 % (ref 0.0–3.0)
Eosinophils Absolute: 0.1 10*3/uL (ref 0.0–0.7)
Eosinophils Relative: 2.4 % (ref 0.0–5.0)
HCT: 44.5 % (ref 39.0–52.0)
Hemoglobin: 14.2 g/dL (ref 13.0–17.0)
Lymphocytes Relative: 33.7 % (ref 12.0–46.0)
Lymphs Abs: 2.1 10*3/uL (ref 0.7–4.0)
MCHC: 31.8 g/dL (ref 30.0–36.0)
MCV: 80.7 fl (ref 78.0–100.0)
Monocytes Absolute: 0.6 10*3/uL (ref 0.1–1.0)
Monocytes Relative: 9.3 % (ref 3.0–12.0)
Neutro Abs: 3.3 10*3/uL (ref 1.4–7.7)
Neutrophils Relative %: 53.7 % (ref 43.0–77.0)
Platelets: 337 10*3/uL (ref 150.0–400.0)
RBC: 5.52 Mil/uL (ref 4.22–5.81)
RDW: 16 % — ABNORMAL HIGH (ref 11.5–15.5)
WBC: 6.2 10*3/uL (ref 4.0–10.5)

## 2022-07-10 LAB — COMPREHENSIVE METABOLIC PANEL
ALT: 28 U/L (ref 0–53)
AST: 28 U/L (ref 0–37)
Albumin: 4.3 g/dL (ref 3.5–5.2)
Alkaline Phosphatase: 56 U/L (ref 39–117)
BUN: 9 mg/dL (ref 6–23)
CO2: 29 mEq/L (ref 19–32)
Calcium: 9.7 mg/dL (ref 8.4–10.5)
Chloride: 105 mEq/L (ref 96–112)
Creatinine, Ser: 0.8 mg/dL (ref 0.40–1.50)
GFR: 97.27 mL/min (ref >60.00)
Glucose, Bld: 94 mg/dL (ref 70–99)
Potassium: 4.4 mEq/L (ref 3.5–5.1)
Sodium: 141 mEq/L (ref 135–145)
Total Bilirubin: 0.4 mg/dL (ref 0.2–1.2)
Total Protein: 8.7 g/dL — ABNORMAL HIGH (ref 6.0–8.3)

## 2022-07-10 LAB — LIPID PANEL
Cholesterol: 172 mg/dL (ref 0–200)
HDL: 54.8 mg/dL (ref >39.00)
LDL Cholesterol: 86 mg/dL (ref 0–99)
NonHDL: 117.61
Total CHOL/HDL Ratio: 3
Triglycerides: 159 mg/dL — ABNORMAL HIGH (ref 0.0–149.0)
VLDL: 31.8 mg/dL (ref 0.0–40.0)

## 2022-07-10 LAB — HEMOGLOBIN A1C: Hgb A1c MFr Bld: 6.1 % (ref 4.6–6.5)

## 2022-07-10 LAB — PSA: PSA: 0.52 ng/mL (ref 0.10–4.00)

## 2022-07-10 MED ORDER — CYCLOBENZAPRINE HCL 10 MG PO TABS: 10.0000 mg | ORAL_TABLET | Freq: Every evening | ORAL | 1 refills | Status: DC | PRN

## 2022-07-10 NOTE — Progress Notes (Signed)
Roberto Hunter 57 y.o.   Chief Complaint  Patient presents with   Acute Visit    Snoring issues, patient last 2021, wants labs done     HISTORY OF PRESENT ILLNESS: This is a 57 y.o. male here for annual exam. Last exam in 2021. Overall healthy but snores.  Requesting sleep studies. No other complaints or medical concerns today.  HPI   Prior to Admission medications   Not on File    No Known Allergies  There are no problems to display for this patient.   Past Medical History:  Diagnosis Date   Fracture, rib    Fracture, toe    Nephrolithiasis 2012    No past surgical history on file.  Social History   Socioeconomic History   Marital status: Married    Spouse name: Not on file   Number of children: 1   Years of education: Not on file   Highest education level: Not on file  Occupational History   Occupation: Education officer, museum  Tobacco Use   Smoking status: Never   Smokeless tobacco: Never  Substance and Sexual Activity   Alcohol use: No    Alcohol/week: 0.0 standard drinks of alcohol   Drug use: No   Sexual activity: Yes    Partners: Female    Birth control/protection: None  Other Topics Concern   Not on file  Social History Narrative   Patient from [place] in 1984. Wife from [place].    Social Determinants of Health   Financial Resource Strain: Not on file  Food Insecurity: Not on file  Transportation Needs: Not on file  Physical Activity: Not on file  Stress: Not on file  Social Connections: Not on file  Intimate Partner Violence: Not on file    Family History  Problem Relation Age of Onset   Colon cancer Neg Hx      Review of Systems  Constitutional: Negative.  Negative for chills and fever.  HENT: Negative.  Negative for congestion and sore throat.   Respiratory: Negative.  Negative for cough and shortness of breath.   Cardiovascular: Negative.  Negative for chest pain and palpitations.  Gastrointestinal: Negative.  Negative  for abdominal pain, diarrhea, nausea and vomiting.  Genitourinary: Negative.  Negative for dysuria and hematuria.  Skin: Negative.  Negative for rash.  Neurological: Negative.  Negative for dizziness and headaches.  All other systems reviewed and are negative.  Vitals:   07/10/22 1116  BP: 132/80  Pulse: 74  Temp: 98.4 F (36.9 C)  SpO2: 97%     Physical Exam Vitals reviewed.  Constitutional:      Appearance: Normal appearance.  HENT:     Head: Normocephalic.     Right Ear: Tympanic membrane, ear canal and external ear normal.     Left Ear: Tympanic membrane, ear canal and external ear normal.     Mouth/Throat:     Mouth: Mucous membranes are moist.     Pharynx: Oropharynx is clear.  Eyes:     Extraocular Movements: Extraocular movements intact.     Conjunctiva/sclera: Conjunctivae normal.     Pupils: Pupils are equal, round, and reactive to light.  Cardiovascular:     Rate and Rhythm: Normal rate and regular rhythm.     Pulses: Normal pulses.     Heart sounds: Normal heart sounds.  Pulmonary:     Effort: Pulmonary effort is normal.     Breath sounds: Normal breath sounds.  Abdominal:  Palpations: Abdomen is soft.     Tenderness: There is no abdominal tenderness.  Musculoskeletal:     Cervical back: No tenderness.     Right lower leg: No edema.     Left lower leg: No edema.  Lymphadenopathy:     Cervical: No cervical adenopathy.  Skin:    General: Skin is warm and dry.  Neurological:     General: No focal deficit present.     Mental Status: He is alert and oriented to person, place, and time.  Psychiatric:        Mood and Affect: Mood normal.        Behavior: Behavior normal.      ASSESSMENT & PLAN: Problem List Items Addressed This Visit   None Visit Diagnoses     Routine general medical examination at a health care facility    -  Primary   Relevant Orders   CBC with Differential   Comprehensive metabolic panel   Hemoglobin A1c   Lipid panel    PSA(Must document that pt has been informed of limitations of PSA testing.)   Suspected sleep apnea       Relevant Orders   Ambulatory referral to Sleep Studies   Need for vaccination       Relevant Orders   Flu Vaccine QUAD 6+ mos PF IM (Fluarix Quad PF) (Completed)   Zoster Recombinant (Shingrix ) (Completed)   Prostate cancer screening       Relevant Orders   PSA(Must document that pt has been informed of limitations of PSA testing.)   Screening for deficiency anemia       Relevant Orders   CBC with Differential   Screening for lipoid disorders       Relevant Orders   Lipid panel   Screening for endocrine, metabolic and immunity disorder       Relevant Orders   Comprehensive metabolic panel   Hemoglobin A1c      Modifiable risk factors discussed with patient. Anticipatory guidance according to age provided. The following topics were also discussed: Social Determinants of Health Smoking non-smoker Diet and nutrition Benefits of exercise Cancer screening and review of most recent colonoscopy report in 2017.  Advised to return in 10 years. Vaccinations reviewed and recommendations Cardiovascular risk assessment and need for blood work Suspected sleep apnea and need for sleep studies Mental health including depression and anxiety Fall and accident prevention  Patient Instructions  Mantenimiento de Radiographer, therapeutic en los hombres Health Maintenance, Male Adoptar un estilo de vida saludable y recibir atencin preventiva son importantes para promover la salud y Counsellor. Consulte al mdico sobre: El esquema adecuado para hacerse pruebas y exmenes peridicos. Cosas que puede hacer por su cuenta para prevenir enfermedades y [place] sano. Qu debo saber sobre la dieta, el peso y el ejercicio? Consuma una dieta saludable  Consuma una dieta que incluya muchas verduras, frutas, productos lcteos con bajo contenido de [place] y Associate Professor. No consuma muchos alimentos ricos  en grasas slidas, azcares agregados o sodio. Mantenga un peso saludable El ndice de masa muscular [facility]) es una medida que puede utilizarse para identificar posibles problemas de [place]. Proporciona una estimacin de la grasa corporal basndose en el peso y la altura. Su mdico puede ayudarle a Engineer, site IMC y a Personnel officer o Pharmacologist un peso saludable. Haga ejercicio con regularidad Haga ejercicio con regularidad. Esta es una de las prcticas ms importantes que puede hacer por su salud. La [employer] de los adultos  deben seguir estas pautas: Realizar, al menos, 150 minutos de actividad fsica por semana. El ejercicio debe aumentar la frecuencia cardaca y Media plannerhacerlo transpirar (ejercicio de intensidad moderada). Hacer ejercicios de fortalecimiento por lo Rite Aidmenos dos veces por semana. Agregue esto a su plan de ejercicio de intensidad moderada. Pase menos tiempo sentado. Incluso la actividad fsica ligera puede ser beneficiosa. Controle sus niveles de colesterol y lpidos en la sangre Comience a realizarse anlisis de lpidos y Oncologistcolesterol en la sangre a los 20 aos y luego reptalos cada 5 aos. Es posible que Insurance underwriternecesite controlar los niveles de colesterol con mayor frecuencia si: Sus niveles de lpidos y colesterol son altos. Es mayor de 40 aos. Presenta un alto riesgo de padecer enfermedades cardacas. Qu debo saber sobre las pruebas de deteccin del cncer? Muchos tipos de cncer pueden detectarse de manera temprana y, a menudo, pueden prevenirse. Segn su historia clnica y sus antecedentes familiares, es posible que deba realizarse pruebas de deteccin del cncer en diferentes edades. Esto puede incluir pruebas de deteccin de lo siguiente: Building services engineerCncer colorrectal. Cncer de prstata. Cncer de piel. Cncer de pulmn. Qu debo saber sobre la enfermedad cardaca, la diabetes y la hipertensin arterial? Presin arterial y enfermedad cardaca La hipertensin arterial causa enfermedades cardacas y  [place] el riesgo de accidente cerebrovascular. Es ms probable que esto se manifieste en las personas que tienen lecturas de presin arterial alta o tienen sobrepeso. Hable con el mdico sobre sus valores de presin arterial deseados. Hgase controlar la presin arterial: Cada 3 a 5 aos si tiene entre 18 y 4939 aos. Todos los aos si es mayor de 40 aos. Si tiene entre 65 y 2375 aos y es fumador o Insurance underwritersola fumar, pregntele al mdico si debe realizarse una prueba de deteccin de aneurisma artico abdominal (AAA) por nica vez. Diabetes Realcese exmenes de deteccin de la diabetes con regularidad. Este anlisis revisa el nivel de azcar en la sangre en [place]. Hgase las pruebas de deteccin: Cada tres aos despus de los 45 aos de edad si tiene un peso normal y un bajo riesgo de padecer diabetes. Con ms frecuencia y a partir de [place] ed[place] inferior si tiene sobrepeso o un alto riesgo de padecer diabetes. Qu debo saber sobre la prevencin de infecciones? Hepatitis B Si tiene un riesgo ms alto de contraer hepatitis B, debe someterse a un examen de deteccin de este virus. Hable con el mdico para averiguar si tiene riesgo de contraer la infeccin por hepatitis B. Hepatitis C Se recomienda un anlisis de [place] para: Todos los que nacieron entre 1945 y [phone]. Todas las personas que tengan un riesgo de haber contrado hepatitis C. Enfermedades de transmisin sexual (ETS) Debe realizarse pruebas de deteccin de ITS todos los aos, incluidas la gonorrea y la clamidia, si: Es sexualmente activo y es menor de [address] aos. Es mayor de [address] aos, y Public affairs consultantel mdico le informa que corre riesgo de tener este tipo de infecciones. La actividad sexual ha cambiado desde que le hicieron la ltima prueba de deteccin y tiene un riesgo mayor de Warehouse managertener clamidia o Copygonorrea. Pregntele al mdico si usted tiene riesgo. Pregntele al mdico si usted tiene un alto riesgo de Primary school teachercontraer VIH. El mdico tambin puede recomendarle un  medicamento recetado para ayudar a evitar la infeccin por el VIH. Si elige tomar medicamentos para prevenir el VIH, primero debe ONEOKhacerse los anlisis de deteccin del VIH. Luego debe hacerse anlisis cada 3 meses mientras est tomando los medicamentos. Siga estas indicaciones en su  casa: Consumo de alcohol No beba alcohol si el mdico se lo prohbe. Si bebe alcohol: Limite la cantidad que consume de 0 a 2 bebidas por da. Sepa cunta cantidad de alcohol hay en las bebidas que toma. En los [place], una medida equivale a una botella de cerveza de 12 oz (355 ml), un vaso de vino de 5 oz (148 ml) o un vaso de una bebida alcohlica de alta graduacin de 1 oz (44 ml). Estilo de vida No consuma ningn producto que contenga nicotina o tabaco. Estos productos incluyen cigarrillos, tabaco para Higher education careers adviser y aparatos de vapeo, como los Psychologist, sport and exercise. Si necesita ayuda para dejar de consumir estos productos, consulte al mdico. No consuma drogas. No comparta agujas. Solicite ayuda a su mdico si necesita apoyo o informacin para abandonar las drogas. Indicaciones generales Realcese los estudios de rutina de la salud, dentales y de Public librarian. [place]. Infrmele a su mdico si: Se siente deprimido con frecuencia. Alguna vez ha sido vctima de [place] o no se siente seguro en su casa. Resumen Adoptar un estilo de vida saludable y recibir atencin preventiva son importantes para promover la salud y Musician. Siga las instrucciones del mdico acerca de una dieta saludable, el ejercicio y la realizacin de pruebas o exmenes para Engineer, building services. Siga las instrucciones del mdico con respecto al control del colesterol y la presin arterial. Esta informacin no tiene Marine scientist el consejo del mdico. Asegrese de hacerle al mdico cualquier pregunta que tenga. Document Revised: 11/10/2020 Document Reviewed: 11/10/2020 Elsevier Patient Education  [place], MD [facility]

## 2022-07-10 NOTE — Patient Instructions (Signed)
Mantenimiento de la salud en los hombres Health Maintenance, Male Adoptar un estilo de vida saludable y recibir atencin preventiva son importantes para promover la salud y el bienestar. Consulte al mdico sobre: El esquema adecuado para hacerse pruebas y exmenes peridicos. Cosas que puede hacer por su cuenta para prevenir enfermedades y mantenerse sano. Qu debo saber sobre la dieta, el peso y el ejercicio? Consuma una dieta saludable  Consuma una dieta que incluya muchas verduras, frutas, productos lcteos con bajo contenido de grasa y protenas magras. No consuma muchos alimentos ricos en grasas slidas, azcares agregados o sodio. Mantenga un peso saludable El ndice de masa muscular (IMC) es una medida que puede utilizarse para identificar posibles problemas de peso. Proporciona una estimacin de la grasa corporal basndose en el peso y la altura. Su mdico puede ayudarle a determinar su IMC y a lograr o mantener un peso saludable. Haga ejercicio con regularidad Haga ejercicio con regularidad. Esta es una de las prcticas ms importantes que puede hacer por su salud. La mayora de los adultos deben seguir estas pautas: Realizar, al menos, 150 minutos de actividad fsica por semana. El ejercicio debe aumentar la frecuencia cardaca y hacerlo transpirar (ejercicio de intensidad moderada). Hacer ejercicios de fortalecimiento por lo menos dos veces por semana. Agregue esto a su plan de ejercicio de intensidad moderada. Pase menos tiempo sentado. Incluso la actividad fsica ligera puede ser beneficiosa. Controle sus niveles de colesterol y lpidos en la sangre Comience a realizarse anlisis de lpidos y colesterol en la sangre a los 20 aos y luego reptalos cada 5 aos. Es posible que necesite controlar los niveles de colesterol con mayor frecuencia si: Sus niveles de lpidos y colesterol son altos. Es mayor de 40 aos. Presenta un alto riesgo de padecer enfermedades cardacas. Qu debo  saber sobre las pruebas de deteccin del cncer? Muchos tipos de cncer pueden detectarse de manera temprana y, a menudo, pueden prevenirse. Segn su historia clnica y sus antecedentes familiares, es posible que deba realizarse pruebas de deteccin del cncer en diferentes edades. Esto puede incluir pruebas de deteccin de lo siguiente: Cncer colorrectal. Cncer de prstata. Cncer de piel. Cncer de pulmn. Qu debo saber sobre la enfermedad cardaca, la diabetes y la hipertensin arterial? Presin arterial y enfermedad cardaca La hipertensin arterial causa enfermedades cardacas y aumenta el riesgo de accidente cerebrovascular. Es ms probable que esto se manifieste en las personas que tienen lecturas de presin arterial alta o tienen sobrepeso. Hable con el mdico sobre sus valores de presin arterial deseados. Hgase controlar la presin arterial: Cada 3 a 5 aos si tiene entre 18 y 39 aos. Todos los aos si es mayor de 40 aos. Si tiene entre 65 y 75 aos y es fumador o sola fumar, pregntele al mdico si debe realizarse una prueba de deteccin de aneurisma artico abdominal (AAA) por nica vez. Diabetes Realcese exmenes de deteccin de la diabetes con regularidad. Este anlisis revisa el nivel de azcar en la sangre en [place]unas. Hgase las pruebas de deteccin: Cada tres aos despus de los 45 aos de edad si tiene un peso normal y un bajo riesgo de padecer diabetes. Con ms frecuencia y a partir de una edad inferior si tiene sobrepeso o un alto riesgo de padecer diabetes. Qu debo saber sobre la prevencin de infecciones? Hepatitis B Si tiene un riesgo ms alto de contraer hepatitis B, debe someterse a un examen de deteccin de este virus. Hable con el mdico para averiguar si tiene riesgo de   contraer la infeccin por hepatitis B. Hepatitis C Se recomienda un anlisis de sangre para: Todos los que nacieron entre 1945 y 1965. Todas las personas que tengan un riesgo de haber  contrado hepatitis C. Enfermedades de transmisin sexual (ETS) Debe realizarse pruebas de deteccin de ITS todos los aos, incluidas la gonorrea y la clamidia, si: Es sexualmente activo y es menor de 24 aos. Es mayor de 24 aos, y el mdico le informa que corre riesgo de tener este tipo de infecciones. La actividad sexual ha cambiado desde que le hicieron la ltima prueba de deteccin y tiene un riesgo mayor de tener clamidia o gonorrea. Pregntele al mdico si usted tiene riesgo. Pregntele al mdico si usted tiene un alto riesgo de contraer VIH. El mdico tambin puede recomendarle un medicamento recetado para ayudar a evitar la infeccin por el VIH. Si elige tomar medicamentos para prevenir el VIH, primero debe hacerse los anlisis de deteccin del VIH. Luego debe hacerse anlisis cada 3 meses mientras est tomando los medicamentos. Siga estas indicaciones en su casa: Consumo de alcohol No beba alcohol si el mdico se lo prohbe. Si bebe alcohol: Limite la cantidad que consume de 0 a 2 bebidas por da. Sepa cunta cantidad de alcohol hay en las bebidas que toma. En los [place], una medida equivale a una botella de cerveza de 12 oz (355 ml), un vaso de vino de 5 oz (148 ml) o un vaso de una bebida alcohlica de alta graduacin de 1 oz (44 ml). Estilo de vida No consuma ningn producto que contenga nicotina o tabaco. Estos productos incluyen cigarrillos, tabaco para mascar y aparatos de vapeo, como los cigarrillos electrnicos. Si necesita ayuda para dejar de consumir estos productos, consulte al mdico. No consuma drogas. No comparta agujas. Solicite ayuda a su mdico si necesita apoyo o informacin para abandonar las drogas. Indicaciones generales Realcese los estudios de rutina de la salud, dentales y de la vista. Mantngase al da con las vacunas. Infrmele a su mdico si: Se siente deprimido con frecuencia. Alguna vez ha sido vctima de maltrato o no se siente seguro en su  casa. Resumen Adoptar un estilo de vida saludable y recibir atencin preventiva son importantes para promover la salud y el bienestar. Siga las instrucciones del mdico acerca de una dieta saludable, el ejercicio y la realizacin de pruebas o exmenes para detectar enfermedades. Siga las instrucciones del mdico con respecto al control del colesterol y la presin arterial. Esta informacin no tiene como fin reemplazar el consejo del mdico. Asegrese de hacerle al mdico cualquier pregunta que tenga. Document Revised: 11/10/2020 Document Reviewed: 11/10/2020 Elsevier Patient Education  2023 Elsevier Inc.  

## 2022-08-17 ENCOUNTER — Encounter: Payer: Self-pay | Admitting: Neurology

## 2022-08-17 ENCOUNTER — Ambulatory Visit (INDEPENDENT_AMBULATORY_CARE_PROVIDER_SITE_OTHER): Payer: BC Managed Care – PPO | Admitting: Neurology

## 2022-08-17 VITALS — BP 134/86 | HR 85 | Ht 62.0 in | Wt 162.8 lb

## 2022-08-17 DIAGNOSIS — R351 Nocturia: Secondary | ICD-10-CM | POA: Diagnosis not present

## 2022-08-17 DIAGNOSIS — G4733 Obstructive sleep apnea (adult) (pediatric): Secondary | ICD-10-CM | POA: Diagnosis not present

## 2022-08-17 DIAGNOSIS — F519 Sleep disorder not due to a substance or known physiological condition, unspecified: Secondary | ICD-10-CM | POA: Insufficient documentation

## 2022-08-17 DIAGNOSIS — R0683 Snoring: Secondary | ICD-10-CM | POA: Diagnosis not present

## 2022-08-17 NOTE — Progress Notes (Signed)
SLEEP MEDICINE CLINIC    Provider:  Larey Seat, MD  Primary Care Physician:  Horald Pollen, [place] [address]     Referring Provider: Agustina Caroli [place], [place],  [place] [address]          Chief Complaint according to patient   Patient presents with:     New Patient (Initial Visit)           HISTORY OF PRESENT ILLNESS:  Roberto Hunter is a 57 y.o. male Hispanic ( [place] ) patient who is seen upon referral on 08/17/2022 from Dr Mitchel Honour  for a sleep consultation.  Chief concern according to patient :  " My wife uses the CPAP machine for about 5 years, and she reports that I snore and choke frequently , even when sleeping on my stomach. I get woken by my wife because I snore, and worse when on my back." I am feeling tired. "   I have the pleasure of seeing Roberto Hunter 08/17/22 a right-handed married  male with a possible sleep disorder.     Sleep relevant medical history: Nocturia 3 times, no surgeries, no trauma, still having his wisdom teeth.    Family medical /sleep history:  no other family member on CPAP with OSA, but grandparents were loud snorer.    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  and lives in a household with spouse,  one dog outdoors, no pets.  Tobacco use: none  ETOH use ; none now ,  Caffeine intake in form of Coffee( 2 in AM and even in PM and evening ) Soda( /) Tea ( /) or energy drinks Exercise in form of  NONE.   Hobbies : fishing.       Sleep habits are as follows: The patient's dinner time is between 6-8 PM.  The patient goes to bed at 11 PM and asleep in 20-30 minutes, continues to sleep for interval  of 2-3  hours, wakes for up to 3 bathroom breaks, the first time at 2 AM.   The preferred sleep position is variable , with the support of 2 pillows.  Dreams are reportedly rare- "I never dream".   The patient  wakes up spontaneously 5.30 . 6  AM is the usual rise time. He reports sometimes not  feeling refreshed or restored in AM, with symptoms such as dry mouth.  Naps are taken infrequently, lasting from 10 to 25 minutes and refreshing , only on weekends.     Review of Systems: Out of a complete 14 system review, the patient complains of only the following symptoms, and all other reviewed systems are negative.:  Fatigue, sleepiness , snoring, fragmented sleep, Insomnia, RLS, Nocturia 3 times, total sleep time less than 6 hours.    How likely are you to doze in the following situations: 0 = not likely, 1 = slight chance, 2 = moderate chance, 3 = high chance   Sitting and Reading? Watching Television? Sitting inactive in a public place (theater or meeting)? As a passenger in a car for an hour without a break? Lying down in the afternoon when circumstances permit? Sitting and talking to someone? Sitting quietly after lunch without alcohol? In a car, while stopped for a few minutes in traffic?   Total = 8/ 24 points   FSS endorsed at 11/ 63 points.  Depression? Anxiety : denied.    Allergic rhinitis.   Social History   Socioeconomic History   Marital status: Married    Spouse name: Not on file   Number of children: 1   Years of education: Not on file   Highest education level: Not on file  Occupational History   Occupation: Education officer, museum  Tobacco Use   Smoking status: Never   Smokeless tobacco: Never  Substance and Sexual Activity   Alcohol use: No    Alcohol/week: 0.0 standard drinks of alcohol   Drug use: No   Sexual activity: Yes    Partners: Female    Birth control/protection: None  Other Topics Concern   Not on file  Social History Narrative   Patient from [place] in 1984. Wife from [place].    Social Determinants of Health   Financial Resource Strain: Not on file  Food Insecurity: Not on file  Transportation Needs: Not on file  Physical Activity: Not  on file  Stress: Not on file  Social Connections: Not on file    Family History  Problem Relation Age of Onset   Colon cancer Neg Hx     Past Medical History:  Diagnosis Date   Fracture, rib    Fracture, toe    Nephrolithiasis 2012    History reviewed. No pertinent surgical history.   Current Outpatient Medications on File Prior to Visit  Medication Sig Dispense Refill   cyclobenzaprine (FLEXERIL) 10 MG tablet Take 1 tablet (10 mg total) by mouth at bedtime as needed for muscle spasms. (Patient not taking: Reported on 08/17/2022) 30 tablet 1   No current facility-administered medications on file prior to visit.    No Known Allergies   DIAGNOSTIC DATA (LABS, IMAGING, TESTING) - I reviewed patient records, labs, notes, testing and imaging myself where available.  Lab Results  Component Value Date   WBC 6.2 07/10/2022   HGB 14.2 07/10/2022   HCT 44.5 07/10/2022   MCV 80.7 07/10/2022   PLT 337.0 07/10/2022      Component Value Date/Time   NA 141 07/10/2022 1157   K 4.4 07/10/2022 1157   CL 105 07/10/2022 1157   CO2 29 07/10/2022 1157   GLUCOSE 94 07/10/2022 1157   BUN 9 07/10/2022 1157   CREATININE 0.80 07/10/2022 1157   CREATININE 0.79 07/16/2015 1354   CALCIUM 9.7 07/10/2022 1157   PROT 8.7 (H) 07/10/2022 1157   ALBUMIN 4.3 07/10/2022 1157   AST 28 07/10/2022 1157   ALT 28 07/10/2022 1157   ALKPHOS 56 07/10/2022 1157   BILITOT 0.4 07/10/2022 1157   GFRNONAA >60 09/02/2017 0709   GFRAA >60 09/02/2017 0709   Lab Results  Component Value Date   CHOL 172 07/10/2022   HDL 54.80 07/10/2022   LDLCALC 86 07/10/2022   TRIG 159.0 (H) 07/10/2022   CHOLHDL 3 07/10/2022   Lab Results  Component Value Date   HGBA1C 6.1 07/10/2022   No results found for: "VITAMINB12" Lab Results  Component Value Date   TSH 1.444 07/16/2015    PHYSICAL EXAM:  Today's Vitals   08/17/22 1440  BP: 134/86  Pulse: 85  Weight: 162 lb 12.8 oz (73.8 kg)  Height: 5' 2"$  (1.575  m)   Body mass index is 29.78 kg/m.   Wt Readings from Last 3 Encounters:  08/17/22 162 lb 12.8 oz (73.8 kg)  07/10/22 170 lb 4 oz (77.2 kg)  03/03/20 164 lb (74.4 kg)  Ht Readings from Last 3 Encounters:  08/17/22 5' 2"$  (1.575 m)  07/10/22 5' 4"$  (1.626 m)  03/03/20 5' 4"$  (1.626 m)      General: The patient is awake, alert and appears not in acute distress. The patient is well groomed. Head: Normocephalic, atraumatic.  Neck is supple. Mallampati 2,  neck circumference:16.5  inches .  Nasal airflow is patent. Deviated septum noted.  Retrognathia is mildly present/   Dental status: biological bruxism marks.  Cardiovascular:  Regular rate and cardiac rhythm by pulse,  without distended neck veins. Respiratory: Lungs are clear to auscultation.  Skin:  Without evidence of ankle edema Trunk: The patient's posture is erect.   NEUROLOGIC EXAM: The patient is awake and alert, oriented to place and time.   Memory subjective described as intact.  Attention span & concentration ability appears normal.  Speech is fluent,  without  dysarthria, dysphonia or aphasia.  Mood and affect are appropriate.   Cranial nerves: no loss of smell or taste reported  Pupils are equal and briskly reactive to light. Funduscopic exam deferred. .  Extraocular movements in vertical and horizontal planes were intact and without nystagmus.  No Diplopia. Visual fields by finger perimetry are intact. Hearing was intact to soft voice and finger rubbing.    Facial sensation intact to fine touch.  Facial motor strength is symmetric and tongue and uvula move midline.  Neck ROM : rotation, tilt and flexion extension were normal for age and shoulder shrug was symmetrical.    Motor exam:  Symmetric bulk, tone and ROM.   Normal tone without cog- wheeling, symmetric grip strength .   Sensory:  Fine touch, and vibration were tested  and  normal.  Proprioception tested in the upper extremities was normal.    Coordination: Rapid alternating movements in the fingers/hands were of normal speed.  The Finger-to-nose maneuver was intact without evidence of ataxia, dysmetria or tremor.   Gait and station: Patient could rise unassisted from a seated position, walked without assistive device.  Stance is of normal width/ base .  Toe and heel walk were deferred.  Deep tendon reflexes: in the  upper and lower extremities are symmetric and intact.  Babinski response was deferred.    ASSESSMENT AND PLAN 57 y.o. year old [place] gentleman presents here with:    1) loud snoring, sleep choking ,dry mouth and bruxism-  suspected to have OSA.   2)  nocturia up to 3 times can be related to OSA .   Plan : I will screen Mr Elko for sleep apnea by a HST , which is the quickest available testing modality. I will order the test based on witnessed sleep choking, snoring and apnea.    I plan to follow up either personally or through our NP within 3-5 months.   I would like to thank Horald Pollen, MD and Agustina Caroli [place], [place],  [place] [address] for allowing me to meet with and to take care of this pleasant patient.    After spending a total time of  35  minutes face to face and additional time for physical and neurologic examination, review of laboratory studies,  personal review of imaging studies, reports and results of other testing and review of referral information / records as far as provided in visit,   Electronically signed by: Larey Seat, MD 08/17/2022 3:29 PM  [name] Sleep Board certified by The [employer] of Sleep Medicine  and Diplomate of the [employer] of Sleep Medicine. Board certified In Neurology through the [place], Fellow of the [employer] of Neurology. Medical Director of [employer].

## 2022-08-17 NOTE — Patient Instructions (Signed)
Quality Sleep Information, Adult Quality sleep is important for your mental and physical health. It also improves your quality of life. Quality sleep means you: Are asleep for most of the time you are in bed. Fall asleep within 30 minutes. Wake up no more than once a night. Are awake for no longer than 20 minutes if you do wake up during the night. Most adults need 7-8 hours of quality sleep each night. How can poor sleep affect me? If you do not get enough quality sleep, you may have: Mood swings. Daytime sleepiness. Decreased alertness, reaction time, and concentration. Sleep disorders, such as insomnia and sleep apnea. Difficulty with: Solving problems. Coping with stress. Paying attention. These issues may affect your performance and productivity at work, school, and home. Lack of sleep may also put you at higher risk for accidents, suicide, and risky behaviors. If you do not get quality sleep, you may also be at higher risk for several health problems, including: Infections. Type 2 diabetes. Heart disease. High blood pressure. Obesity. Worsening of long-term conditions, like arthritis, kidney disease, depression, Parkinson's disease, and epilepsy. What actions can I take to get more quality sleep? Sleep schedule and routine Stick to a sleep schedule. Go to sleep and wake up at about the same time each day. Do not try to sleep less on weekdays and make up for lost sleep on weekends. This does not work. Limit naps during the day to 30 minutes or less. Do not take naps in the late afternoon. Make time to relax before bed. Reading, listening to music, or taking a hot bath promotes quality sleep. Make your bedroom a place that promotes quality sleep. Keep your bedroom dark, quiet, and at a comfortable room temperature. Make sure your bed is comfortable. Avoid using electronic devices that give off bright blue light for 30 minutes before bedtime. Your brain perceives bright blue light  as sunlight. This includes television, phones, and computers. If you are lying awake in bed for longer than 20 minutes, get up and do a relaxing activity until you feel sleepy. Lifestyle     Try to get at least 30 minutes of exercise on most days. Do not exercise 2-3 hours before going to bed. Do not use any products that contain nicotine or tobacco. These products include cigarettes, chewing tobacco, and vaping devices, such as e-cigarettes. If you need help quitting, ask your health care provider. Do not drink caffeinated beverages for at least 8 hours before going to bed. Coffee, tea, and some sodas contain caffeine. Do not drink alcohol or eat large meals close to bedtime. Try to get at least 30 minutes of sunlight every day. Morning sunlight is best. Medical concerns Work with your health care provider to treat medical conditions that may affect sleeping, such as: Nasal obstruction. Snoring. Sleep apnea and other sleep disorders. Talk to your health care provider if you think any of your prescription medicines may cause you to have difficulty falling or staying asleep. If you have sleep problems, talk with a sleep consultant. If you think you have a sleep disorder, talk with your health care provider about getting evaluated by a specialist. Where to find more information Sleep Foundation: sleepfoundation.org American Academy of Sleep Medicine: aasm.org Centers for Disease Control and Prevention (CDC): StoreMirror.com.cy Contact a health care provider if: You have trouble getting to sleep or staying asleep. You often wake up very early in the morning and cannot get back to sleep. You have daytime sleepiness. You  have daytime sleep attacks of suddenly falling asleep and sudden muscle weakness (narcolepsy). You have a tingling sensation in your legs with a strong urge to move your legs (restless legs syndrome). You stop breathing briefly during sleep (sleep apnea). You think you have a sleep  disorder or are taking a medicine that is affecting your quality of sleep. Summary Most adults need 7-8 hours of quality sleep each night. Getting enough quality sleep is important for your mental and physical health. Make your bedroom a place that promotes quality sleep, and avoid things that may cause you to have poor sleep, such as alcohol, caffeine, smoking, or large meals. Talk to your health care provider if you have trouble falling asleep or staying asleep. This information is not intended to replace advice given to you by your health care provider. Make sure you discuss any questions you have with your health care provider. Document Revised: 09/28/2021 Document Reviewed: 09/28/2021 Elsevier Patient Education  [place] [employer]: Sleep In this video, you will learn why sleep is an important part of a healthy lifestyle. To view the content, go to this web address: https://pe.elsevier.com/s5aDUouV  This video will expire on: 05/31/2024. If you need access to this video following this date, please reach out to the healthcare provider who assigned it to you. This information is not intended to replace advice given to you by your health care provider. Make sure you discuss any questions you have with your health care provider. Elsevier Patient Education  [place].

## 2022-09-07 ENCOUNTER — Telehealth: Payer: Self-pay | Admitting: Neurology

## 2022-09-07 NOTE — Telephone Encounter (Signed)
HST- [employer] Josem Kaufmann: [id number] (exp. 09/07/22 to 11/05/22)  Patient is [facility] at [facility] for 09/26/22 at 9 AM.  Mailed packet to the patient.

## 2022-09-26 ENCOUNTER — Ambulatory Visit: Payer: BC Managed Care – PPO | Admitting: Neurology

## 2022-09-26 DIAGNOSIS — G4733 Obstructive sleep apnea (adult) (pediatric): Secondary | ICD-10-CM

## 2022-09-26 DIAGNOSIS — F519 Sleep disorder not due to a substance or known physiological condition, unspecified: Secondary | ICD-10-CM

## 2022-09-26 DIAGNOSIS — R0683 Snoring: Secondary | ICD-10-CM

## 2022-09-26 DIAGNOSIS — R351 Nocturia: Secondary | ICD-10-CM

## 2022-09-27 NOTE — Progress Notes (Signed)
[facility]  Roberto Hunter 57 year old male 08-25-1965   HOME SLEEP TEST REPORT ( by Watch PAT)   STUDY DATE:  09-27-2022   ORDERING CLINICIAN: Melvyn Novas, MD  REFERRING CLINICIAN: Dr Otilio Jefferson, MD    CLINICAL INFORMATION/HISTORY: 08-17-2022: sleep consultation.  Chief concern according to patient :  " My wife uses the CPAP machine for about 5 years, and she reports that I snore and choke frequently , even when sleeping on my stomach. I get woken by my wife because I snore, and worse when on my back." I am feeling tired. "  Loud snoring, sleep choking ,dry mouth and bruxism-  suspected to have OSA. Nocturia up to 3 times can be related to OSA .     Epworth sleepiness score:8 /24. FSS at 11/ 63 points    BMI: 29.8 kg/m   Neck Circumference: 16.5 "   FINDINGS:   Sleep Summary:   Total Recording Time (hours, min): 8 hours 39 minutes      Total Sleep Time (hours, min):    7 hours 44 minutes             Percent REM (%):    27.3%                                    Respiratory Indices:   Calculated pAHI (per hour): 22.4/h   , no central apneas were reported                        REM pAHI:     28.5/h                                            NREM pAHI: 20.2/h                            Positional AHI: The longest sleep time was seen in prone position associated with an AHI of 24.4, followed by supine sleep with an AHI of 45/h.  Snoring reached a mean volume of 40 dB but was only present for 5% of total sleep time.                                                 Oxygen Saturation Statistics:        O2 Saturation Range (%): Between a nadir of 87 and a maximum saturation of 98% with a mean saturation of 94%.                                      O2 Saturation (minutes) <89%: 0.3 minutes          Pulse Rate Statistics:   Pulse Mean (bpm): 48 bpm               Pulse Range:     Between 37 and 86 bpm            IMPRESSION:  This HST confirms  the presence of moderate severe obstructive sleep apnea and significant difficulties to maintain sleep.  REM latency was only 35 minutes. Supine sleep position clearly accentuated the apnea and hypopnea index, but did not have major impact on the snoring volume.   RECOMMENDATION: I would recommend auto titration CPAP for this patient, this is usually the most effective treatment. His apnea form can be reduced by dental device or inspire device as well, but that means a reduction by about 60%, while we expect PAP therapy to achieve a reduction by 90% or more.   ResMed Auto CPAP settings between 5-16 cm water, heated humidification, interface of choice and 2 cm EPR.   Rv after 30-80 days on therapy.    INTERPRETING PHYSICIAN:   Melvyn Novas, MD   Medical Director of [facility] at [facility].

## 2022-10-03 NOTE — Addendum Note (Signed)
Addended by: Melvyn Novas on: 10/03/2022 05:01 PM   Modules accepted: Orders

## 2022-10-03 NOTE — Procedures (Signed)
[facility]  Roberto Hunter 57 year old male 1965-08-22   HOME SLEEP TEST REPORT ( by Watch PAT)   STUDY DATE:  09-27-2022   ORDERING CLINICIAN: Melvyn Novas, MD  REFERRING CLINICIAN: Dr Otilio Jefferson, MD    CLINICAL INFORMATION/HISTORY: 08-17-2022: sleep consultation.  Chief concern according to patient :  " My wife uses the CPAP machine for about 5 years, and she reports that I snore and choke frequently , even when sleeping on my stomach. I get woken by my wife because I snore, and worse when on my back." I am feeling tired. "  Loud snoring, sleep choking ,dry mouth and bruxism-  suspected to have OSA. Nocturia up to 3 times can be related to OSA .     Epworth sleepiness score:8 /24. FSS at 11/ 63 points    BMI: 29.8 kg/m   Neck Circumference: 16.5 "   FINDINGS:   Sleep Summary:   Total Recording Time (hours, min): 8 hours 39 minutes      Total Sleep Time (hours, min):    7 hours 44 minutes             Percent REM (%):    27.3%                                    Respiratory Indices:   Calculated pAHI (per hour): 22.4/h   , no central apneas were reported                        REM pAHI:     28.5/h                                            NREM pAHI: 20.2/h                            Positional AHI: The longest sleep time was seen in prone position associated with an AHI of 24.4, followed by supine sleep with an AHI of 45/h.  Snoring reached a mean volume of 40 dB but was only present for 5% of total sleep time.                                                 Oxygen Saturation Statistics:        O2 Saturation Range (%): Between a nadir of 87 and a maximum saturation of 98% with a mean saturation of 94%.                                      O2 Saturation (minutes) <89%: 0.3 minutes          Pulse Rate Statistics:   Pulse Mean (bpm): 48 bpm               Pulse Range:     Between 37 and 86 bpm            IMPRESSION:  This HST confirms the  presence  the presence of moderate severe obstructive sleep apnea and significant difficulties to maintain sleep.  REM latency was only 35 minutes. Supine sleep position clearly accentuated the apnea and hypopnea index, but did not have major impact on the snoring volume.   RECOMMENDATION: I would recommend auto titration CPAP for this patient, this is usually the most effective treatment. His apnea form can be reduced by dental device or inspire device as well, but that means a reduction by about 60%, while we expect PAP therapy to achieve a reduction by 90% or more.   ResMed Auto CPAP settings between 5-16 cm water, heated humidification, interface of choice and 2 cm EPR.   Rv after 30-80 days on therapy.    INTERPRETING PHYSICIAN:   Jadalee Westcott, MD   Medical Director of [facility].                      

## 2022-10-05 ENCOUNTER — Telehealth: Payer: Self-pay

## 2022-10-05 NOTE — Telephone Encounter (Signed)
Pt returned call, I advised pt that Dr. Golden Hurter reviewed their sleep study results and found that pt moderate to sever. Dr. Vickey Huger recommends that pt . I reviewed PAP compliance expectations with the pt. Pt is agreeable to starting a CPAP. I advised pt that an order will be sent to a DME, [facility], and [facility] will call the pt within about one week after they file with the pt's insurance. [facility] will show the pt how to use the machine, fit for masks, and troubleshoot the CPAP if needed. A follow up appt was made for insurance purposes with Ihor Austin on July 31 345. Pt verbalized understanding to arrive 15 minutes early and bring their CPAP. Pt verbalized understanding of results. Pt had no questions at this time but was encouraged to call back if questions arise. I have sent the order to [facility] and have received confirmation that they have received the order.

## 2022-10-05 NOTE — Telephone Encounter (Signed)
Contacted pt, no answer. Contacted home, wife stated he was at work. Advised to call office back when he can.

## 2022-10-05 NOTE — Telephone Encounter (Signed)
-----   Message from Melvyn Novas, MD sent at 10/03/2022  5:01 PM EDT ----- See my result note, CPAP recommended.   I would recommend auto titration CPAP for this patient, this is usually the most effective treatment. His apnea form can be reduced by dental device or inspire device as well, but that means a reduction by about 60%, while we expect PAP therapy to achieve a reduction by 90% or more.    ResMed Auto CPAP settings between 5-16 cm water, heated humidification, interface of choice and 2 cm EPR.    Rv after 30-80 days on therapy.

## 2022-10-09 ENCOUNTER — Ambulatory Visit (INDEPENDENT_AMBULATORY_CARE_PROVIDER_SITE_OTHER): Payer: BC Managed Care – PPO

## 2022-10-09 DIAGNOSIS — Z23 Encounter for immunization: Secondary | ICD-10-CM

## 2022-10-09 NOTE — Progress Notes (Signed)
After obtaining consent, and per orders of Dr. Alvy Bimler, injection of Shingle given by Ferdie Ping. Patient instructed to report any adverse reaction to me immediately.

## 2022-10-27 DIAGNOSIS — G4733 Obstructive sleep apnea (adult) (pediatric): Secondary | ICD-10-CM | POA: Diagnosis not present

## 2022-11-27 DIAGNOSIS — G4733 Obstructive sleep apnea (adult) (pediatric): Secondary | ICD-10-CM | POA: Diagnosis not present

## 2022-12-05 DIAGNOSIS — G4733 Obstructive sleep apnea (adult) (pediatric): Secondary | ICD-10-CM | POA: Diagnosis not present

## 2022-12-27 DIAGNOSIS — G4733 Obstructive sleep apnea (adult) (pediatric): Secondary | ICD-10-CM | POA: Diagnosis not present

## 2023-01-08 DIAGNOSIS — G4733 Obstructive sleep apnea (adult) (pediatric): Secondary | ICD-10-CM | POA: Diagnosis not present

## 2023-01-17 ENCOUNTER — Ambulatory Visit: Payer: BC Managed Care – PPO | Admitting: Adult Health

## 2023-01-17 ENCOUNTER — Encounter: Payer: Self-pay | Admitting: Adult Health

## 2023-01-17 VITALS — BP 131/82 | HR 94 | Ht 62.0 in | Wt 166.0 lb

## 2023-01-17 DIAGNOSIS — G4733 Obstructive sleep apnea (adult) (pediatric): Secondary | ICD-10-CM | POA: Diagnosis not present

## 2023-01-17 NOTE — Patient Instructions (Signed)
Recommend you be set up to have a CPAP titration study completed in our office - you will be called to get this set up  In the mean time, we will adjust your pressure settings to see if this helps improve your residual amount of apneas. If your apneas get better, we can cancel the titration study    Follow up in 6 months or call earlier if needed

## 2023-01-17 NOTE — Progress Notes (Signed)
[name] [address] [place]. [name] [address] [phone]       OFFICE FOLLOW UP NOTE  [name] Date of Birth:  [birth year] Medical Record Number:  [id number]   Reason for visit: Initial CPAP follow-up    SUBJECTIVE:   CHIEF COMPLAINT:  Chief Complaint  Patient presents with   Follow-up    Pt alone, rm 8 here for initial cpap. Overall doing well with it. DME [facility].    Follow-up visit:  Prior visit: 08/17/2022 with Dr. Vickey Huger (consult visit)  Brief HPI:   Roberto Hunter is a 57 y.o. male who was initially seen by Dr. Vickey Huger on 08/17/2022 for concern of underlying sleep apnea with reported snoring, daytime fatigue, sleep choking, and nocturia.  Completed HST 09/26/2022 which showed moderate severe OSA with total AHI 22.4/h, supine sleep noted AHI 45/h.  AutoPap set up date 10/27/2022.   Interval history:  Compliance report shows satisfactory usage although elevated AHI at 10.6.  Reports tolerating CPAP well.  Feels like he is getting more restorative sleep.  Wakes up once throughout the night, previously 3-4 times.  Currently using hybrid mask.  Reports multiple different sleep positions, either on side, prone or supine.  ESS 7/24 (prior to CPAP 8/24).             ROS:   14 system review of systems performed and negative with exception of those listed in HPI  PMH:  Past Medical History:  Diagnosis Date   Fracture, rib    Fracture, toe    Nephrolithiasis 2012   Obstructive sleep apnea syndrome 08/17/2022    PSH: No past surgical history on file.  Social History:  Social History   Socioeconomic History   Marital status: Married    Spouse name: Not on file   Number of children: 1   Years of education: Not on file   Highest education level: Not on file  Occupational History   Occupation: International aid/development worker  Tobacco Use   Smoking status: Never   Smokeless tobacco: Never  Substance and Sexual Activity   Alcohol use:  No    Alcohol/week: 0.0 standard drinks of alcohol   Drug use: No   Sexual activity: Yes    Partners: Female    Birth control/protection: None  Other Topics Concern   Not on file  Social History Narrative   Patient from [place] in [place]. Wife from [place]    Social Determinants of Health   Financial Resource Strain: Not on file  Food Insecurity: Not on file  Transportation Needs: Not on file  Physical Activity: Not on file  Stress: Not on file  Social Connections: Not on file  Intimate Partner Violence: Not on file    Family History:  Family History  Problem Relation Age of Onset   Colon cancer Neg Hx     Medications:   No current outpatient medications on file prior to visit.   No current facility-administered medications on file prior to visit.    Allergies:  No Known Allergies    OBJECTIVE:  Physical Exam  Vitals:   01/17/23 1517  BP: 131/82  Pulse: 94  Weight: 166 lb (75.3 kg)  Height: 5\' 2"  (1.575 m)   Body mass index is 30.36 kg/m. No results found.   General: well developed, well nourished, very pleasant middle-age male, seated, in no evident distress Head: head normocephalic and atraumatic.   Neck: supple with no carotid or supraclavicular bruits  Cardiovascular: regular rate and rhythm, no murmurs Musculoskeletal: no deformity Skin:  no rash/petichiae Vascular:  Normal pulses all extremities   Neurologic Exam Mental Status: Awake and fully alert. Oriented to place and time. Recent and remote memory intact. Attention span, concentration and fund of knowledge appropriate. Mood and affect appropriate.  Cranial Nerves: Pupils equal, briskly reactive to light. Extraocular movements full without nystagmus. Visual fields full to confrontation. Hearing intact. Facial sensation intact. Face, tongue, palate moves normally and symmetrically.  Motor: Normal bulk and tone. Normal strength in all tested extremity muscles Sensory.: intact to touch ,  pinprick , position and vibratory sensation.  Coordination: Rapid alternating movements normal in all extremities. Finger-to-nose and heel-to-shin performed accurately bilaterally. Gait and Station: Arises from chair without difficulty. Stance is normal. Gait demonstrates normal stride length and balance without use of AD. Tandem walk and heel toe without difficulty.  Reflexes: 1+ and symmetric. Toes downgoing.         ASSESSMENT/PLAN: [name] is a 57 y.o. year old male    OSA on CPAP : Compliance report shows satisfactory usage although elevated residual AHI at 10.6 with central apnea index 9.0.  Recommend pursuing CPAP titration study.  Discussed with sleep lab manager, currently scheduling titration studies end of October therefore will adjust pressure setting from auto titration to set pressure of 10 (pressure in the 95th percentile 9.8, maximum 10.9). Will repeat download in 1 month. If improved, will cancel titration study.  Discussed avoidance of supine sleep.  Discussed continued nightly usage with ensuring greater than 4 hours nightly for optimal benefit and per insurance purposes.  Continue to follow with DME company for any needed supplies or CPAP related concerns     Follow up in 6 months or call earlier if needed   CC:  PCP: Georgina Quint, MD    I spent 31 minutes of face-to-face and non-face-to-face time with patient.  This included previsit chart review, lab review, study review, order entry, electronic health record documentation, patient education regarding diagnosis of sleep apnea with review and discussion of compliance report and answered all other questions to patient's satisfaction   Ihor Austin, [facility]  [facility] [address] [place], [place] [address]  Phone [phone] Fax [phone] Note: This document was prepared with digital dictation and possible smart phrase technology. Any transcriptional  errors that result from this process are unintentional.

## 2023-01-27 DIAGNOSIS — G4733 Obstructive sleep apnea (adult) (pediatric): Secondary | ICD-10-CM | POA: Diagnosis not present

## 2023-02-01 ENCOUNTER — Telehealth: Payer: Self-pay | Admitting: Adult Health

## 2023-02-01 NOTE — Telephone Encounter (Signed)
CPAP- [employer] [place] Harvey: [id number] (exp. 01/31/23 to 03/31/23)   Patient is scheduled at [facility] for 02/18/23 at 9 pm.  Mailed packet to the patient.

## 2023-02-18 ENCOUNTER — Ambulatory Visit (INDEPENDENT_AMBULATORY_CARE_PROVIDER_SITE_OTHER): Payer: BC Managed Care – PPO | Admitting: Neurology

## 2023-02-18 DIAGNOSIS — F519 Sleep disorder not due to a substance or known physiological condition, unspecified: Secondary | ICD-10-CM

## 2023-02-18 DIAGNOSIS — G4739 Other sleep apnea: Secondary | ICD-10-CM

## 2023-02-18 DIAGNOSIS — G4733 Obstructive sleep apnea (adult) (pediatric): Secondary | ICD-10-CM | POA: Diagnosis not present

## 2023-02-21 ENCOUNTER — Other Ambulatory Visit: Payer: Self-pay | Admitting: Neurology

## 2023-02-21 DIAGNOSIS — G4739 Other sleep apnea: Secondary | ICD-10-CM | POA: Insufficient documentation

## 2023-02-21 DIAGNOSIS — G4733 Obstructive sleep apnea (adult) (pediatric): Secondary | ICD-10-CM | POA: Diagnosis not present

## 2023-02-21 NOTE — Procedures (Signed)
[facility] CPAP TITRATION INTERPRETATION REPORT   STUDY DATE: 02/18/2023      PATIENT NAME:  Roberto Hunter         DATE OF BIRTH:  1966-03-01  PATIENT ID:  [id number]    TYPE OF STUDY:  CPAP  READING PHYSICIAN: Melvyn Novas, MD Test ordered/ Referred by: NP Ihor Austin SCORING TECHNICIAN: Domingo Cocking, RPSGT   HISTORY: Roberto Hunter is a 57 year-old patient who started on CPAP following a HST 09-26-2022 and was set up with autotitration CPAP device with a setting of 5-16 cm water on  10-27-2022, in his follow up appointment it was noted that he had a high residual AHI and central apneas arising.  AHI 10.6/h and 9/h were central apneas. He still reported he benefitted from PAP therapy. AirFit F40 FFM was used. The Epworth Sleepiness Scale was endorsed at 7 out of 24 (scores above or equal to 10 are suggestive of hypersomnolence).  DESCRIPTION: A sleep technologist was in attendance for the duration of the recording.  Data collection, scoring, video monitoring, and reporting were performed in compliance with the AASM Manual for the Scoring of Sleep and Associated Events; (Hypopnea is scored based on the criteria listed in Section VIII D. 1b in the AASM Manual V2.6 using a 4% oxygen desaturation rule or Hypopnea is scored based on the criteria listed in Section VIII D. 1a in the AASM Manual V2.6 using 3% oxygen desaturation and /or arousal rule).  A physician certified by the American Board of Sleep Medicine reviewed each epoch of the study.  ADDITIONAL INFORMATION:  Height: 62.0 in Weight: 162 lbs (BMI 29) Neck Size: 16.5 in  MEDICATIONS: Flexeril   SLEEP CONTINUITY AND SLEEP ARCHITECTURE:  Lights off was at 21:44: and lights on 04:41: (7.0 hours in bed). The patient chose a FFM and started at 5 cm water pressure, which was gradually titrated to 14 cm water pressure.    Total sleep time was 362.0 minutes (46.0% supine; 54.0% lateral; 0.0% prone,  10.4% REM sleep), with a normal sleep efficiency at 86.8%.  Sleep latency was normal at 7.5 minutes. Wake after sleep onset (WASO) time accounted for 47 minutes.    Of the total sleep time, the percentage of stage N1 sleep was 5.0%, stage N2 sleep was 75.4%, stage N3 sleep was 9.3%, and REM sleep was 10.4%. There were 5 Stage R periods observed on this study night, 15 awakenings (i.e. transitions to Stage W from any sleep stage), and 61.0 total stage transitions.   RESPIRATORY MONITORING:   Based on AASM criteria (using a 3% oxygen desaturation and /or arousal rule for scoring hypopneas), there were 33 apneas (7 obstructive; 12 central; 14 mixed), and 12 hypopneas. Apnea index was 5.5. Hypopnea index was 2.0. The apnea-hypopnea index was 7.5/h overall (9.0 supine, 12.9 non-supine; 9.6 REM, 7.7 supine REM). There were 0 respiratory effort-related arousals (RERAs).   OXIMETRY: Total sleep time spent at, or below 88% was 0.7 minutes, or 0.2% of total sleep time. Respiratory events were associated with oxyhemoglobin desaturations (nadir during sleep 84%, from a mean of 97%). BODY POSITION: Duration of total sleep and percent of total sleep in their respective position is as follows: supine 166 minutes (46.0%), non-supine 195.5 minutes (54.0%); right 131 minutes (36.2%), left 64 minutes (17.8%), and prone 00 minutes (0.0%). Total supine REM sleep time was 23 minutes (62.7% of total REM sleep). LIMB MOVEMENTS: There were 0 periodic limb movements of  sleep (0.0/h), of which 0 (0.0/h) were associated with an arousal.   CARDIAC: The EKG documented bradycardia in SR .  The average heart rate during sleep was 46 bpm.  The maximum heart rate during sleep was 70 bpm. The maximum heart rate during recording was 78 bpm.  AROUSAL: There were 56 arousals in total, for an arousal index of 9.3 arousals/hour.  Of these, 8 were identified as respiratory-related arousals (1.3 /h), 0 were PLM-related arousals (0.0 /h), and 39  were non-specific arousals (6.5 /h)   IMPRESSION:  The patient did well on CPAP of 14 cm water with an AHI of less than 3/h. There were more apneas noted in supine sleep, and supine sleep was not recorded at this last explored pressure with 2cm EPR. Mask used was a FFM AirFit 40  s/w.         RECOMMENDATIONS: Please reset CPAP to 6-16 cm water with 2 cm EPR and use the mask : ResMed FFM AirFit 40 in s/w.   Avoiding supine sleep is still a goal, which may be more difficult with any FFM. Consider using a travel pillow ( horse shoe shaped pillow) to allow side sleep without dislodging the mask.   The goal for this patient should be a residual AHI of 5/h.      Melvyn Novas, MD            General Information  Name: Roberto Hunter BMI: 29 Physician: ,   ID: [id number] Height: 62 in Technician: Domingo Cocking  Sex: Male Weight: 162 lb Record: [id number]  Age: 70 [[birth year]] Date: 02/18/2023 Scorer: Domingo Cocking   Recommended Settings IPAP: N/A cmH20 EPAP: N/A cmH2O AHI: N/A AHI (4%): N/A   Pressure IPAP/EPAP 00 05 07 09 10 11 13 14    O2 Vol 0.0 0.0 0.0 0.0 0.0 0.0 0.0 0.0  Time TRT 0.21m 56.47m 107.50m 25.53m 40.36m 95.20m 37.74m 55.57m   TST 0.78m 45.61m 100.32m 17.12m 40.52m 88.57m 27.61m 43.31m  Sleep Stage % Wake 0.0 6.2 6.1 31.4 1.2 7.3 27.0 0.0   % REM 0.0 0.0 5.5 0.0 2.5 22.6 25.9 9.3   % N1 0.0 8.8 4.0 11.4 3.8 1.7 14.8 2.3   % N2 0.0 91.2 57.2 88.6 93.8 75.7 59.3 88.4   % N3 0.0 0.0 33.3 0.0 0.0 0.0 0.0 0.0  Respiratory  3% AASM Total Events 0 5 12 3 2 6 15 2    Obs. Apn. 0 0 4 1 0 2 0 0   Mixed Apn. 0 1 4 0 1 1 5 2    Cen. Apn. 0 0 2 0 0 2 8 0   Hypopneas 0 4 2 2 1 1 2  0   AHI 0.00 6.59 7.16 10.29 3.00 4.07 33.33 2.79   Supine AHI 0.00 6.59 16.00 10.29 3.00 11.71 10.91 0.00   Prone AHI 0.00 0.00 0.00 0.00 0.00 0.00 0.00 0.00   Side AHI 0.00 0.00 1.90 0.00 0.00 1.76 39.07 2.79  Respiratory   (4%) CMS Hypopneas (4%) 0.00 0.00 0.00 2.00 0.00 0.00 2.00 0.00   AHI (4%)  0.00 1.32 5.97 10.29 1.50 3.39 33.33 2.79   Supine AHI (4%) 0.00 1.32 16.00 10.29 1.50 11.71 10.91 0.00   Prone AHI (4%) 0.00 0.00 0.00 0.00 0.00 0.00 0.00 0.00   Side AHI (4%) 0.00 0.00 0.00 0.00 0.00 0.88 39.07 2.79  Desat Profile <= 90% 0.15m 0.84m 0.61m 3.70m 0.83m 0.54m 1.88m 0.10m   <= 80% 0.34m 0.20m 0.61m 3.59m 0.36m 0.52m  0.20m 0.18m   <= 70% 0.47m 0.28m 0.74m 3.76m 0.37m 0.37m 0.22m 0.81m   <= 60% 0.14m 0.18m 0.64m 3.76m 0.61m 0.29m 0.63m 0.9m  Arousal Index Apnea 0.0 0.0 3.0 0.0 0.0 0.0 2.2 1.4   Hypopnea 0.0 1.3 0.0 0.0 0.0 0.0 0.0 0.0   LM 0.0 1.3 2.4 0.0 0.0 1.4 4.4 4.2   Spontaneous 0.0 0.0 4.2 6.9 13.5 8.1 6.7 8.4   Medical & Medication History    JOHATHAN SENGER is a 57 y.o. male who was initially seen by Dr. Vickey Huger on 08/17/2022 for concern of underlying sleep apnea with reported snoring, daytime fatigue, sleep choking, and nocturia. Completed HST 09/26/2022 which showed moderate severe OSA with total AHI 22.4/h, supine sleep noted AHI 45/h. AutoPap set up date 10/27/2022. Interval history: Compliance report shows satisfactory usage although elevated AHI at 10.6. Reports tolerating CPAP well. Feels like he is getting more restorative sleep. Wakes up once throughout the night, previously 3-4 times. Currently using hybrid mask. Reports multiple different sleep positions, either on side, prone or supine. ESS 7/24 (prior to CPAP 8/24).  Flexeril   Sleep Disorder      Comments   Patient arrived for a CPAP titration polysomnogram. Procedure explained and all questions answered. Patient has been compliant with Auto PAP 5 - 16 with EPR at 2. Avg pressure has been 10cm. Central events are showing at 9/hour. Patient did not bring his mask. He wears a full face mask. He was shown CPAP at 5cm with a S/M [place] FFM and an s/w AirFit F40 FFM. He preferred the AirFit F40 FFM. CPAP started at 5cm/H2o with heated humidity, using the s/w AirFit F40 FFM. CPAP increased to 14 cm/H2O, in an effort to control respiratory events and  abolish snoring. EPR at 2 was used. Transitional events and central as well as mixed events were noted. No obvious cardiac arrhythmias noted. Patient slept supine, left, and right. Mild PLMS without significant sleep disruption was observed. Patient had one restroom visit. 360    CPAP start time: 09:44:08 PM CPAP end time: 04:41:10 AM   Time Total Supine Side Prone Upright  Recording (TRT) 6h 57.35m 3h 21.73m 3h 35.29m 0h 0.62m 0h 0.15m  Sleep (TST) 6h 2.48m 2h 46.89m 3h 15.97m 0h 0.101m 0h 0.69m   Latency N1 N2 N3 REM Onset Per. Slp. Eff.  Actual 0h 7.83m 0h 13.28m 1h 8.53m 1h 17.15m 0h 7.3m 0h 10.52m 86.81%   Stg Dur Wake N1 N2 N3 REM  Total 42.5 18.0 273.0 33.5 37.5  Supine 33.0 11.5 124.5 7.0 23.5  Side 9.5 6.5 148.5 26.5 14.0  Prone 0.0 0.0 0.0 0.0 0.0  Upright 0.0 0.0 0.0 0.0 0.0   Stg % Wake N1 N2 N3 REM  Total 10.5 5.0 75.4 9.3 10.4  Supine 8.2 3.2 34.4 1.9 6.5  Side 2.3 1.8 41.0 7.3 3.9  Prone 0.0 0.0 0.0 0.0 0.0  Upright 0.0 0.0 0.0 0.0 0.0     Apnea Summary Sub Supine Side Prone Upright  Total 33 Total 33 18 15 0 0    REM 4 3 1  0 0    NREM 29 15 14  0 0  Obs 7 REM 2 2 0 0 0    NREM 5 5 0 0 0  Mix 14 REM 1 0 1 0 0    NREM 13 6 7  0 0  Cen 12 REM 1 1 0 0 0    NREM 11 4 7  0 0  Rera Summary Sub Supine Side Prone Upright  Total 0 Total 0 0 0 0 0    REM 0 0 0 0 0    NREM 0 0 0 0 0   Hypopnea Summary Sub Supine Side Prone Upright  Total 12 Total 12 7 5  0 0    REM 2 0 2 0 0    NREM 10 7 3  0 0   4% Hypopnea Summary Sub Supine Side Prone Upright  Total (4%) 4 Total 4 2 2  0 0    REM 0 0 0 0 0    NREM 4 2 2  0 0     AHI Total Obs Mix Cen  7.46 Apnea 5.47 1.16 2.32 1.99   Hypopnea 1.99 -- -- --  6.13 Hypopnea (4%) 0.66 -- -- --   3% Total Supine Side Prone Upright  Position AHI 7.46 9.01 6.14 0.00 0.00  REM AHI 9.60   NREM AHI 7.21   Position RDI 7.46 9.01 6.14 0.00 0.00  REM RDI 9.60   NREM RDI 7.21    4% Hypopnea Total Supine Side Prone Upright  Position AHI (4%) 6.13  7.21 5.22 0.00 0.00  REM AHI (4%) 6.40   NREM AHI (4%) 6.10   Position RDI (4%) 6.13 7.21 5.22 0.00 0.00  REM RDI (4%) 6.40   NREM RDI (4%) 6.10    Desaturation Information  <100% <90% <80% <70% <60% <50% <40%  Supine 27 0 0 0 0 0 0  Side 13 2 0 0 0 0 0  Prone 0 0 0 0 0 0 0  Upright 0 0 0 0 0 0 0  Total 40 2 0 0 0 0 0  Desaturation threshold setting: 4% Minimum desaturation setting: 10 seconds SaO2 nadir: 84% The longest event was a 40 sec central Apnea with a minimum SaO2 of 88%. The lowest SaO2 was 84% associated with a 30 sec central Apnea. EKG Rates EKG Avg Max Min  Awake 54 78 41(!)  Asleep 46 70 39  EKG Events: Bradycardia , sinus  Awakening/Arousal Information # of Awakenings 15  Wake after sleep onset 47.10m  Wake after persistent sleep 45.27m   Arousal Assoc. Arousals Index  Apneas 7 1.2  Hypopneas 1 0.2  Leg Movements 12 2.0  Snore 0.0 0.0  PTT Arousals 0 0.0  Spontaneous 39 6.5  Total 59 9.8  Myoclonus Information PLMS LMs Index  Total LMs during PLMS 0 0.0  LMs w/ Microarousals 0 0.0   LM LMs Index  w/ Microarousal 12 2.0  w/ Awakening 1 0.2  w/ Resp Event 0 0.0  Spontaneous 5 0.8  Total 17 2.8

## 2023-02-21 NOTE — Progress Notes (Signed)
reset CPAP to 6-16 cm water with 2 cm EPR and use the mask : ResMed FFM AirFit 40 in s/w. CD

## 2023-02-22 ENCOUNTER — Telehealth: Payer: Self-pay | Admitting: Neurology

## 2023-02-22 ENCOUNTER — Other Ambulatory Visit: Payer: Self-pay | Admitting: Neurology

## 2023-02-22 DIAGNOSIS — G4733 Obstructive sleep apnea (adult) (pediatric): Secondary | ICD-10-CM

## 2023-02-22 NOTE — Telephone Encounter (Signed)
-----   Message from [place] sent at 02/21/2023  3:10 PM EDT ----- Please advise patient that recent titration study showed adequate treatment of sleep apnea under pressure of 14. He was previously on AutoPap settings of 5-16 with EPR level 2 and elevated AHI therefore switched to set pressure of 10 but AHI remains elevated.  Discussed with sleep manager Meagan who recommended adjusting CPAP settings to a set pressure of 14 as apnea well controlled at this setting during titration study. Will adjust via ResMed. Will repeat download in 1-2 months. Thank you.

## 2023-02-22 NOTE — Telephone Encounter (Signed)
Called the pt and spoke to the wife on [place]. Reviewed the results with her and advised pressure change would be made on the machine to set pressure of 14 cm. I will send the updated order to the company for their records and I have made the adjustment in airview for the pt.

## 2023-04-03 NOTE — Telephone Encounter (Signed)
Please advise patient that repeat CPAP download with new set pressure of 14 showed excellent improvement of residual AHI currently at 4.5 (previously 10.6).  Will continue current pressure setting of 14 for now. Will plan on rechecking CPAP download at follow up visit in February.

## 2023-04-03 NOTE — Telephone Encounter (Signed)
Called the patient and there was no answer. LVM advising pt to call back.  **When pt returns call, please advise that Shanda Bumps pulled a download from his CPAP machine to see how the current pressure setting was doing and it is currently treating his apnea much better than it was. Please advise to continue using the machine at current settings and we will follow up at next visit in Feb.

## 2023-07-23 ENCOUNTER — Ambulatory Visit: Payer: BC Managed Care – PPO | Admitting: Adult Health

## 2023-08-21 ENCOUNTER — Telehealth: Payer: Self-pay | Admitting: Adult Health

## 2023-08-21 NOTE — Telephone Encounter (Signed)
 Called patient 2x no answer couldn't leave message VM not set up yet

## 2023-08-21 NOTE — Telephone Encounter (Signed)
 I called to confirm pt appt with Shanda Bumps for 08/22/23; pt cancelled appt b/c he says he never recv'd the equipment to go with his machine, so therefore not using it.

## 2023-08-21 NOTE — Progress Notes (Deleted)
 [name] [address] [place]. [place] [address] [phone]       OFFICE FOLLOW UP NOTE  Mr. SLYVESTER Hunter Date of Birth:  05/24/1966 Medical Record Number:  [id number]   Reason for visit: Initial CPAP follow-up    SUBJECTIVE:   CHIEF COMPLAINT:  No chief complaint on file.  Follow-up visit:  Prior visit: 01/17/2023  Brief HPI:   Roberto Hunter is a 58 y.o. male who was initially seen by Dr. Vickey Huger on 08/17/2022 for concern of underlying sleep apnea with reported snoring, daytime fatigue, sleep choking, and nocturia.  Completed HST 09/26/2022 which showed moderate severe OSA with total AHI 22.4/h, supine sleep noted AHI 45/h.  AutoPap set up date 10/27/2022.  At prior visit, recommended pursuing CPAP titration due to elevated residual AHI at 10.6 with central apnea index 9.0 on no AutoPap setting of 5-16 with EPR 2.  CPAP titration showed improvement of apnea under set pressure of 14 therefore was switched from AutoPap to CPAP at set pressure 14.  Repeat CPAP download 1 month after setting change showed excellent improvement of apnea with residual AHI of 4.5.   Interval history:  Returns today for 22-month CPAP compliance visit.   Compliance report shows satisfactory usage although elevated AHI at 10.6.  Reports tolerating CPAP well.  Feels like he is getting more restorative sleep.  Wakes up once throughout the night, previously 3-4 times.  Currently using hybrid mask.  Reports multiple different sleep positions, either on side, prone or supine.  ESS 7/24 (prior to CPAP 8/24).             ROS:   14 system review of systems performed and negative with exception of those listed in HPI  PMH:  Past Medical History:  Diagnosis Date   Fracture, rib    Fracture, toe    Nephrolithiasis 2012   Obstructive sleep apnea syndrome 08/17/2022    PSH: No past surgical history on file.  Social History:  Social History   Socioeconomic History    Marital status: Married    Spouse name: Not on file   Number of children: 1   Years of education: Not on file   Highest education level: Not on file  Occupational History   Occupation: International aid/development worker  Tobacco Use   Smoking status: Never   Smokeless tobacco: Never  Substance and Sexual Activity   Alcohol use: No    Alcohol/week: 0.0 standard drinks of alcohol   Drug use: No   Sexual activity: Yes    Partners: Female    Birth control/protection: None  Other Topics Concern   Not on file  Social History Narrative   [place] from [place] in [place]. Wife from [place]    Social Drivers of Health   Financial Resource Strain: Not on file  Food Insecurity: Not on file  Transportation Needs: Not on file  Physical Activity: Not on file  Stress: Not on file  Social Connections: Not on file  Intimate Partner Violence: Not on file    Family History:  Family History  Problem Relation Age of Onset   Colon cancer Neg Hx     Medications:   No current outpatient medications on file prior to visit.   No current facility-administered medications on file prior to visit.    Allergies:  No Known Allergies    OBJECTIVE:  Physical Exam  There were no vitals filed for this visit.  There is no height or  weight on file to calculate BMI. No results found.   General: well developed, well nourished, very pleasant middle-age male, seated, in no evident distress Head: head normocephalic and atraumatic.   Neck: supple with no carotid or supraclavicular bruits Cardiovascular: regular rate and rhythm, no murmurs Musculoskeletal: no deformity Skin:  no rash/petichiae Vascular:  Normal pulses all extremities   Neurologic Exam Mental Status: Awake and fully alert. Oriented to place and time. Recent and remote memory intact. Attention span, concentration and fund of knowledge appropriate. Mood and affect appropriate.  Cranial Nerves: Pupils equal, briskly reactive to light.  Extraocular movements full without nystagmus. Visual fields full to confrontation. Hearing intact. Facial sensation intact. Face, tongue, palate moves normally and symmetrically.  Motor: Normal bulk and tone. Normal strength in all tested extremity muscles Sensory.: intact to touch , pinprick , position and vibratory sensation.  Coordination: Rapid alternating movements normal in all extremities. Finger-to-nose and heel-to-shin performed accurately bilaterally. Gait and Station: Arises from chair without difficulty. Stance is normal. Gait demonstrates normal stride length and balance without use of AD. Tandem walk and heel toe without difficulty.  Reflexes: 1+ and symmetric. Toes downgoing.         ASSESSMENT/PLAN: Roberto Hunter is a 58 y.o. year old male    OSA on CPAP : Compliance report shows satisfactory usage although elevated residual AHI at 10.6 with central apnea index 9.0.  Recommend pursuing CPAP titration study.  Discussed with sleep lab manager, currently scheduling titration studies end of October therefore will adjust pressure setting from auto titration to set pressure of 10 (pressure in the 95th percentile 9.8, maximum 10.9). Will repeat download in 1 month. If improved, will cancel titration study.  Discussed avoidance of supine sleep.  Discussed continued nightly usage with ensuring greater than 4 hours nightly for optimal benefit and per insurance purposes.  Continue to follow with DME company for any needed supplies or CPAP related concerns     Follow up in 6 months or call earlier if needed   CC:  PCP: Georgina Quint, MD    I spent 31 minutes of face-to-face and non-face-to-face time with patient.  This included previsit chart review, lab review, study review, order entry, electronic health record documentation, patient education regarding diagnosis of sleep apnea with review and discussion of compliance report and answered all other questions to patient's  satisfaction   Ihor Austin, [facility]  [facility] [address] [place], [place] [address]  Phone [phone] Fax [phone] Note: This document was prepared with digital dictation and possible smart phrase technology. Any transcriptional errors that result from this process are unintentional.

## 2023-08-22 ENCOUNTER — Ambulatory Visit: Payer: BC Managed Care – PPO | Admitting: Adult Health

## 2023-08-22 NOTE — Telephone Encounter (Signed)
 In meantime sent a message to [facility] to check on supply status and see if they can contact the pt to ensure using machine

## 2023-12-06 ENCOUNTER — Other Ambulatory Visit: Payer: Self-pay | Admitting: Emergency Medicine

## 2023-12-06 MED ORDER — DICLOFENAC SODIUM 75 MG PO TBEC: 75.0000 mg | DELAYED_RELEASE_TABLET | Freq: Two times a day (BID) | ORAL | 1 refills | Status: AC | PRN
